# Patient Record
Sex: Male | Born: 2000 | Hispanic: Yes | Marital: Single | Smoking: Never smoker
Health system: Southern US, Community
[De-identification: ages and names within clinical notes are randomized; demographics above are authoritative.]

---
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
## 2021-01-28 ENCOUNTER — Emergency Department: Payer: Medicaid - Out of State

## 2021-01-28 ENCOUNTER — Other Ambulatory Visit: Payer: Self-pay

## 2021-01-28 ENCOUNTER — Encounter
Admission: EM | Disposition: A | Discharge status: Home / Self Care | Payer: Self-pay | Source: Home / Self Care | Attending: Neurosurgery

## 2021-01-28 ENCOUNTER — Inpatient Hospital Stay
Admission: EM | Admit: 2021-01-28 | Discharge: 2021-01-30 | DRG: 030 | Disposition: A | Discharge status: Home / Self Care | Payer: Medicaid - Out of State | Attending: Neurosurgery | Admitting: Neurosurgery

## 2021-01-28 DIAGNOSIS — G834 Cauda equina syndrome: Principal | ICD-10-CM | POA: Diagnosis present

## 2021-01-28 DIAGNOSIS — M5136 Other intervertebral disc degeneration, lumbar region: Secondary | ICD-10-CM

## 2021-01-28 DIAGNOSIS — R339 Retention of urine, unspecified: Secondary | ICD-10-CM | POA: Diagnosis present

## 2021-01-28 DIAGNOSIS — M51369 Other intervertebral disc degeneration, lumbar region without mention of lumbar back pain or lower extremity pain: Secondary | ICD-10-CM

## 2021-01-28 DIAGNOSIS — M5126 Other intervertebral disc displacement, lumbar region: Secondary | ICD-10-CM | POA: Diagnosis present

## 2021-01-28 DIAGNOSIS — M5127 Other intervertebral disc displacement, lumbosacral region: Secondary | ICD-10-CM | POA: Diagnosis present

## 2021-01-28 DIAGNOSIS — Z9889 Other specified postprocedural states: Secondary | ICD-10-CM

## 2021-01-28 DIAGNOSIS — R200 Anesthesia of skin: Secondary | ICD-10-CM

## 2021-01-28 DIAGNOSIS — Z20822 Contact with and (suspected) exposure to covid-19: Secondary | ICD-10-CM | POA: Diagnosis present

## 2021-01-28 DIAGNOSIS — R262 Difficulty in walking, not elsewhere classified: Secondary | ICD-10-CM | POA: Diagnosis present

## 2021-01-28 DIAGNOSIS — M48061 Spinal stenosis, lumbar region without neurogenic claudication: Secondary | ICD-10-CM

## 2021-01-28 HISTORY — PX: LUMBAR LAMINECTOMY/DECOMPRESSION MICRODISCECTOMY: SHX5026

## 2021-01-28 LAB — CBC
HCT: 47 % (ref 39.0–52.0)
Hemoglobin: 17.4 g/dL — ABNORMAL HIGH (ref 13.0–17.0)
MCH: 33.1 pg (ref 26.0–34.0)
MCHC: 37 g/dL — ABNORMAL HIGH (ref 30.0–36.0)
MCV: 89.5 fL (ref 80.0–100.0)
Platelets: 247 10*3/uL (ref 150–400)
RBC: 5.25 MIL/uL (ref 4.22–5.81)
RDW: 12 % (ref 11.5–15.5)
WBC: 7.2 10*3/uL (ref 4.0–10.5)
nRBC: 0 % (ref 0.0–0.2)

## 2021-01-28 LAB — URINALYSIS, COMPLETE (UACMP) WITH MICROSCOPIC
Bacteria, UA: NONE SEEN
Bilirubin Urine: NEGATIVE
Glucose, UA: NEGATIVE mg/dL
Hgb urine dipstick: NEGATIVE
Ketones, ur: 15 mg/dL — AB
Leukocytes,Ua: NEGATIVE
Nitrite: NEGATIVE
Protein, ur: NEGATIVE mg/dL
Specific Gravity, Urine: 1.025 (ref 1.005–1.030)
Squamous Epithelial / LPF: NONE SEEN (ref 0–5)
pH: 7 (ref 5.0–8.0)

## 2021-01-28 LAB — BASIC METABOLIC PANEL
Anion gap: 7 (ref 5–15)
BUN: 14 mg/dL (ref 6–20)
CO2: 28 mmol/L (ref 22–32)
Calcium: 9.4 mg/dL (ref 8.9–10.3)
Chloride: 102 mmol/L (ref 98–111)
Creatinine, Ser: 0.79 mg/dL (ref 0.61–1.24)
GFR, Estimated: >60 mL/min (ref >60)
Glucose, Bld: 94 mg/dL (ref 70–99)
Potassium: 4 mmol/L (ref 3.5–5.1)
Sodium: 137 mmol/L (ref 135–145)

## 2021-01-28 IMAGING — CR DG LUMBAR SPINE 2-3V
1 series · 3 of 3 positions shown · non-contrast
Comparison: None.

CLINICAL DATA: Back pain

EXAM:
LUMBAR SPINE - 2-3 VIEW

[Series 1: dg lumbar spine 2-3 views · 0.14mm/px · 3 of 3 slices shown]
[im 1/3]
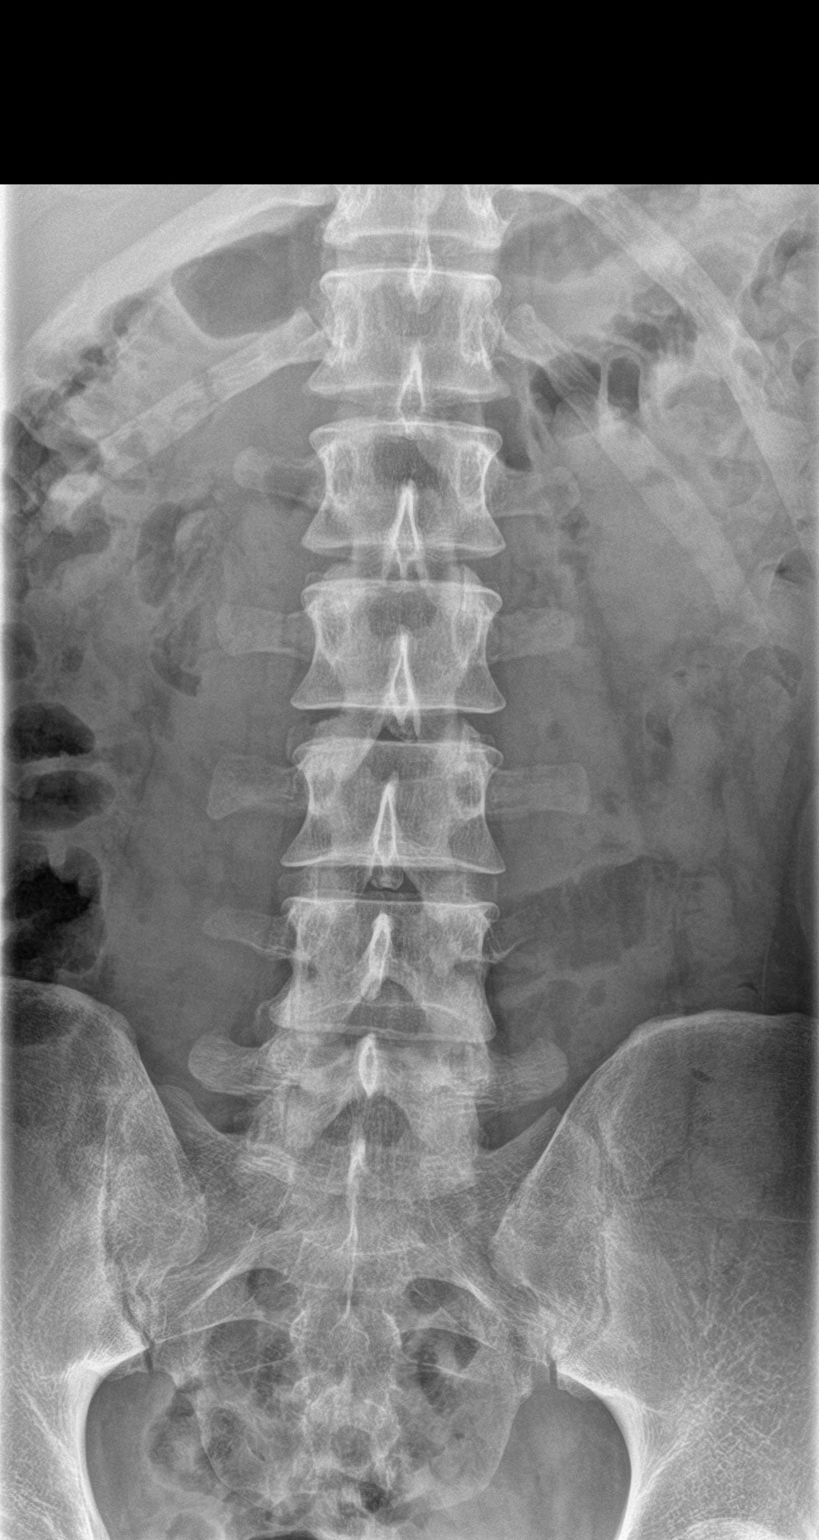
[im 2/3]
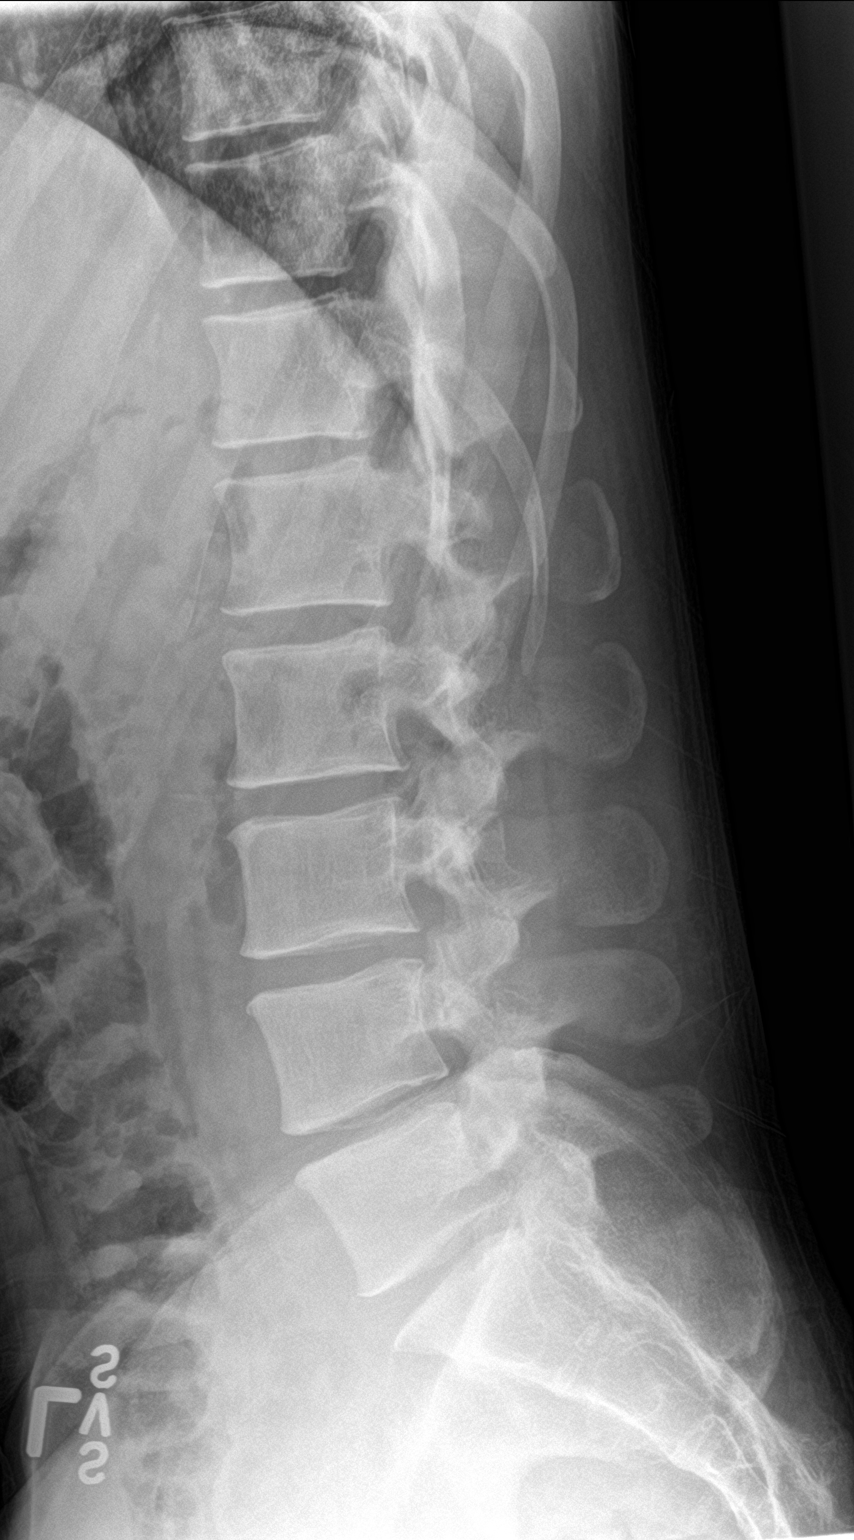
[im 3/3]
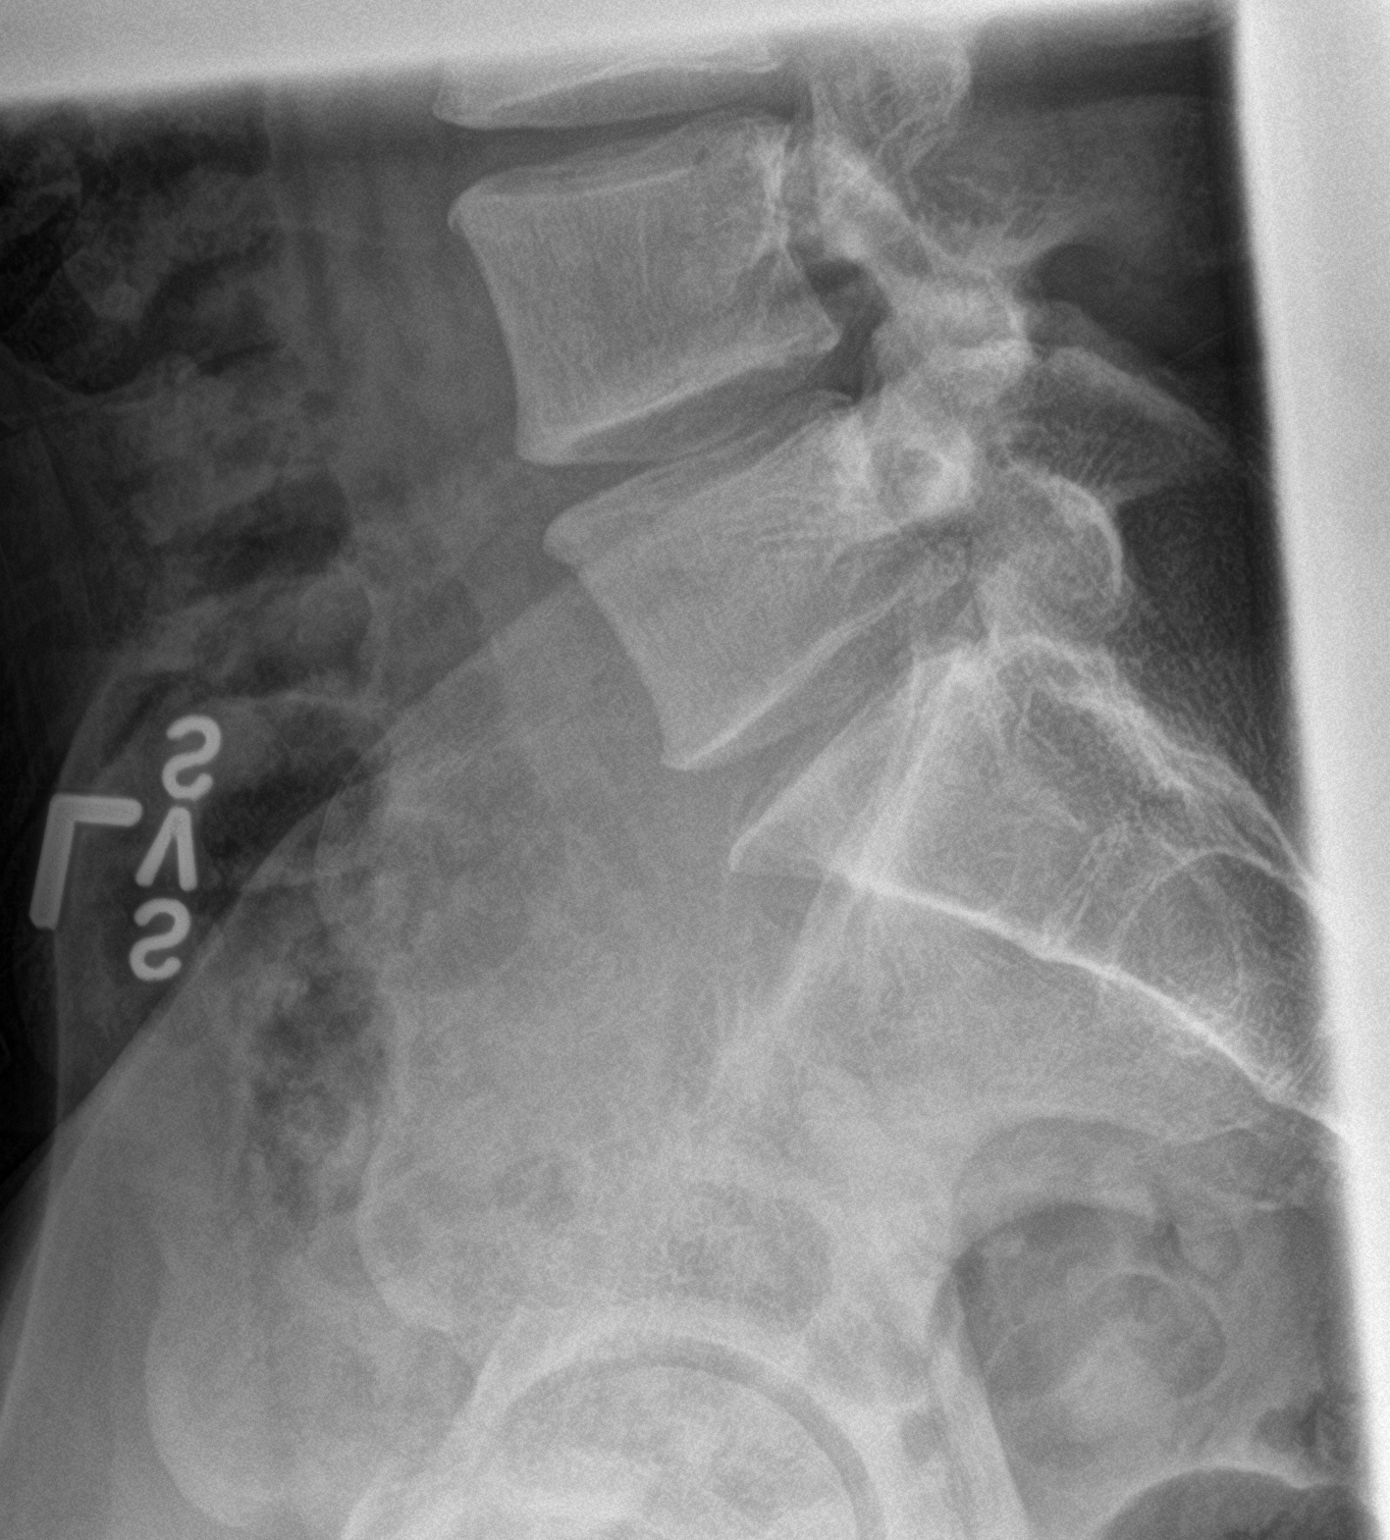

[3 of 3 positions shown; findings below may reference images not displayed]

FINDINGS: There are 5 non-rib-bearing lumbar-type vertebral bodies. Vertebral
body heights are preserved. There is no evidence of acute fracture.
Alignment is normal. The intervertebral disc spaces are preserved.
There is no significant degenerative change.

The SI joints are intact.
IMPRESSION: Normal lumbar spine radiographs.

## 2021-01-28 IMAGING — MR MR THORACIC SPINE W/O CM
6 series · 32 of 48 positions shown · non-contrast
Comparison: None.

CLINICAL DATA: Initial evaluation for intervertebral disc disorder,
numbness from tail low into both lower extremities.

EXAM:
MRI THORACIC AND LUMBAR SPINE WITHOUT CONTRAST
TECHNIQUE: Multiplanar and multiecho pulse sequences of the thoracic and lumbar
spine were obtained without intravenous contrast.

[Series 16: T1 · sagittal · 5.0mm · 1.88mm/px · 2 of 9 slices shown (1 of 2)]
[im 1/9]
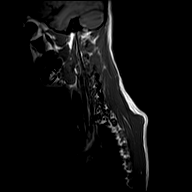
[im 9/9]
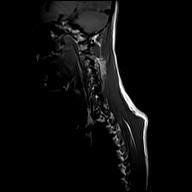

[Series 17: T2 · sagittal · 3.0mm · 1.33mm/px · 6 of 17 slices shown (1 of 2)]
[im 1/17]
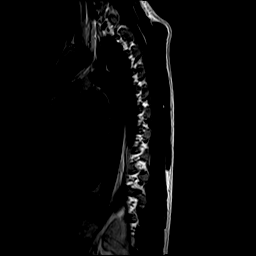
[im 4/17]
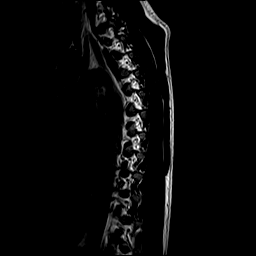
[im 7/17]
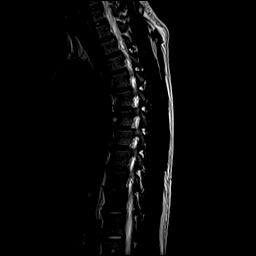
[im 10/17]
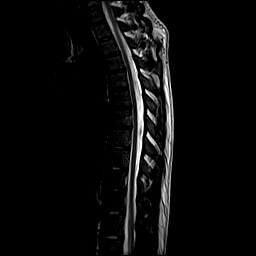
[im 13/17]
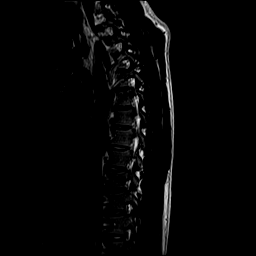
[im 17/17]
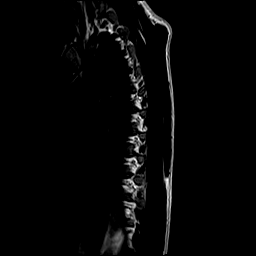

[Series 18: T1 · sagittal · 3.0mm · 1.33mm/px · 6 of 17 slices shown (2 of 2)]
[im 1/17]
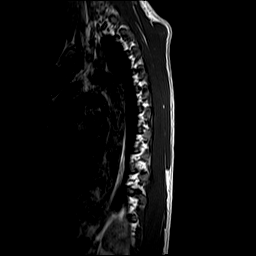
[im 4/17]
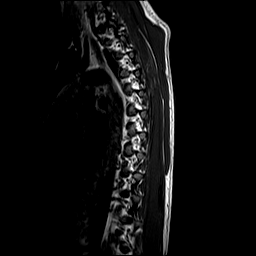
[im 7/17]
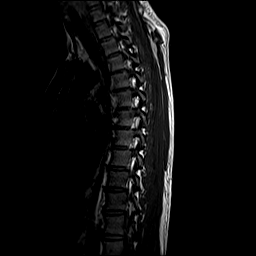
[im 10/17]
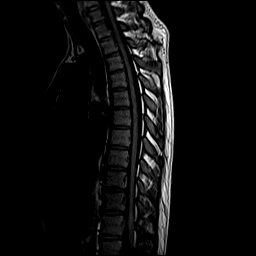
[im 13/17]
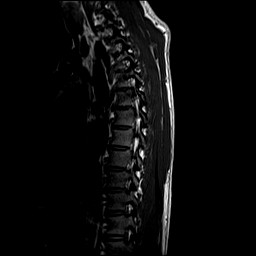
[im 17/17]
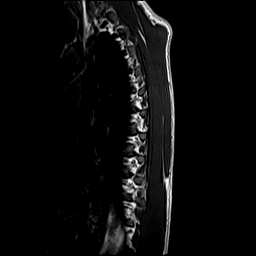

[Series 19: STIR · sagittal · 3.0mm · 0.66mm/px · 6 of 17 slices shown]
[im 1/17]
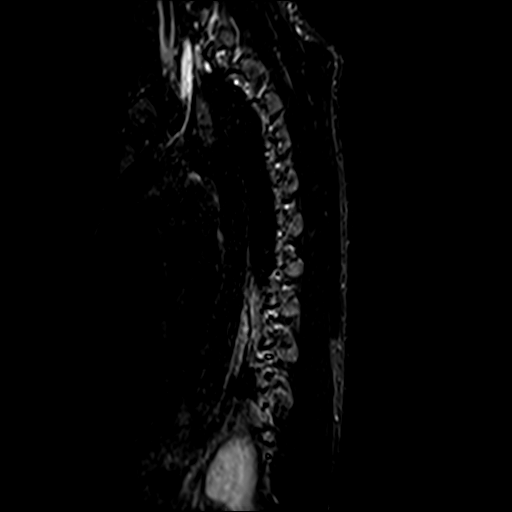
[im 4/17]
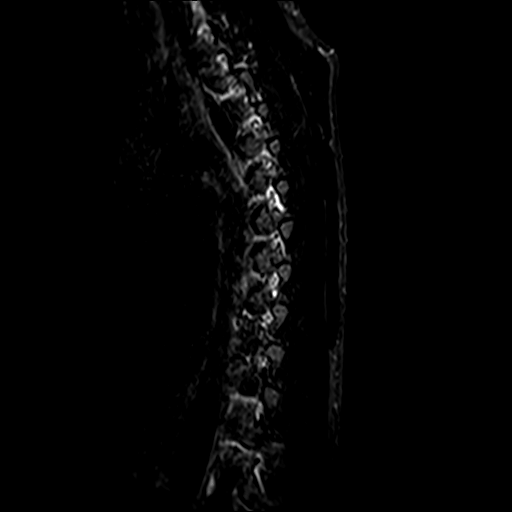
[im 7/17]
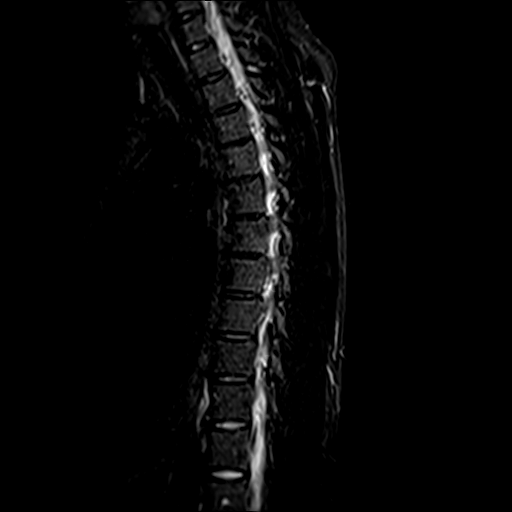
[im 10/17]
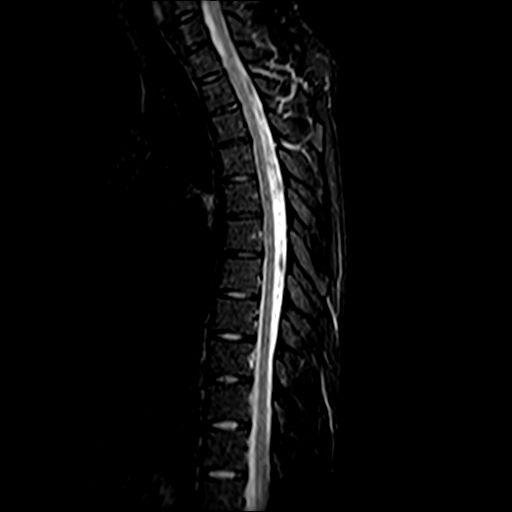
[im 13/17]
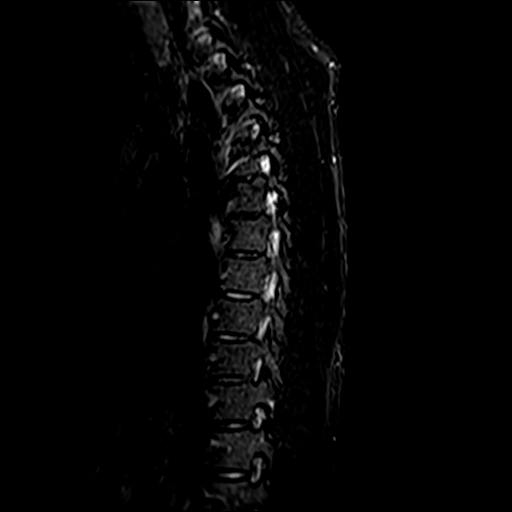
[im 17/17]
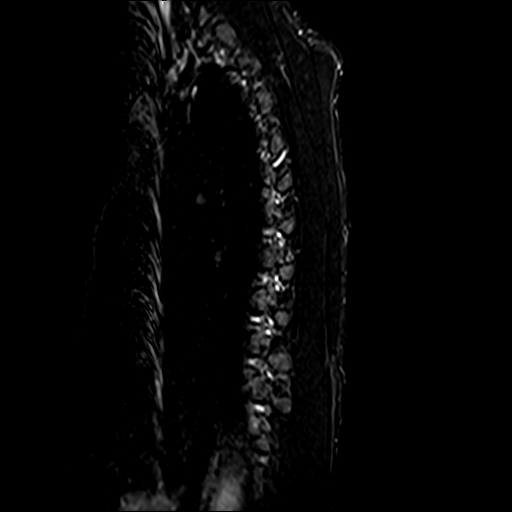

[Series 20: T2 · axial · 4.0mm · 0.59mm/px · z∈[-412,-112]mm · 8 of 39 slices shown (2 of 2)]
[im 1/39]
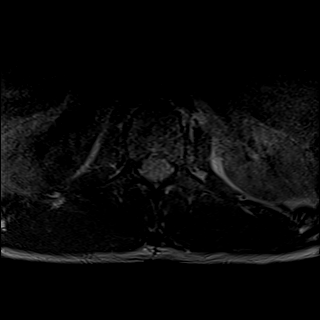
[im 6/39]
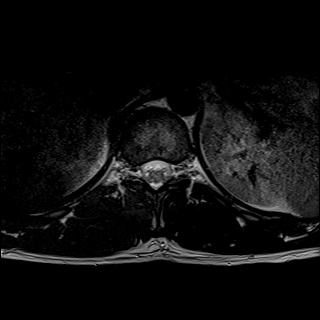
[im 12/39]
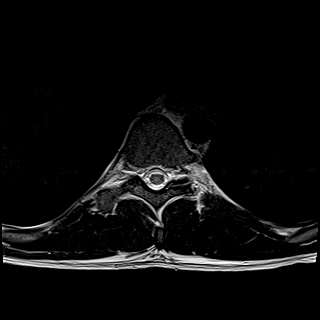
[im 18/39]
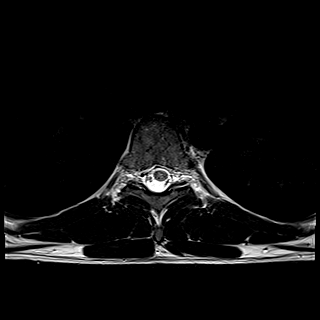
[im 21/39]
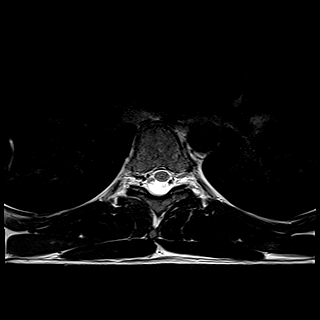
[im 27/39]
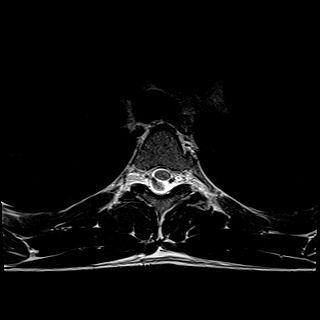
[im 33/39]
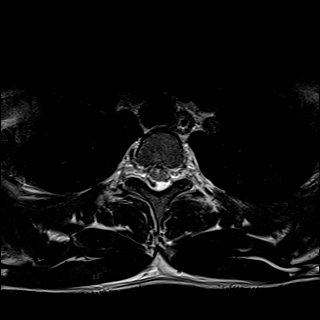
[im 39/39]
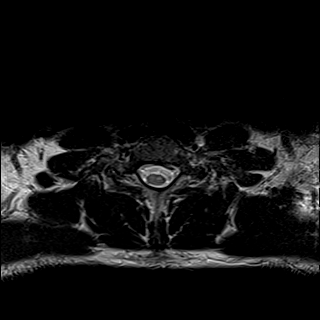

[Series 22: GRE · axial · 4.0mm · 0.37mm/px · z∈[-412,-243]mm · 4 of 39 slices shown]
[im 1/39]
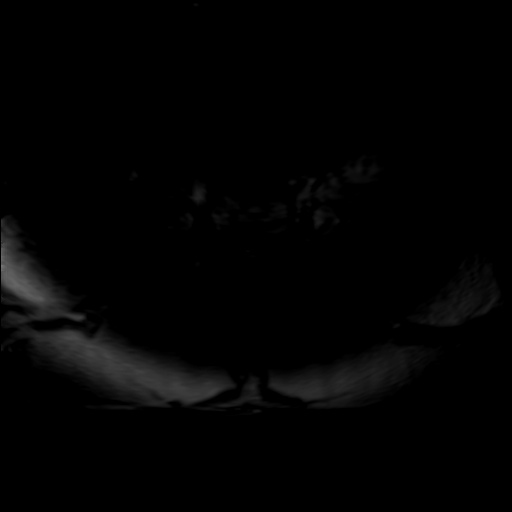
[im 6/39]
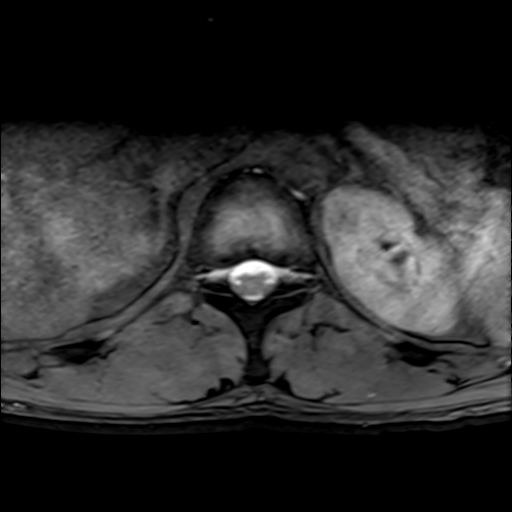
[im 12/39]
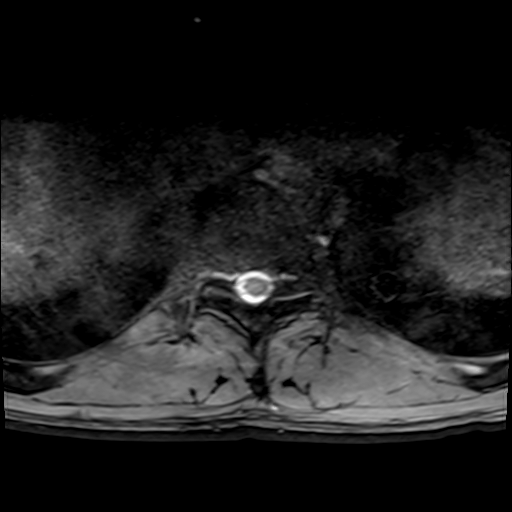
[im 18/39]
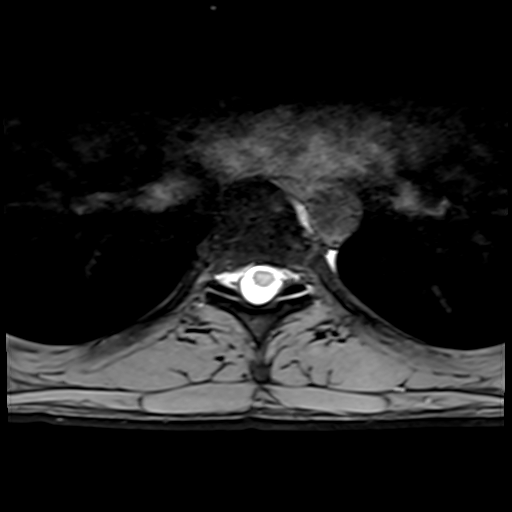

[32 of 48 positions shown; findings below may reference images not displayed]

FINDINGS: MRI THORACIC SPINE FINDINGS

Alignment: Straightening of the normal thoracic kyphosis. No
listhesis.

Vertebrae: Vertebral body height maintained without acute or chronic
fracture. Bone marrow signal intensity within normal limits. No
discrete or worrisome osseous lesions. No abnormal marrow edema.

Cord:  Normal signal and morphology.

Paraspinal and other soft tissues: Unremarkable.

Disc levels:

T3-4: Disc desiccation with tiny central disc protrusion. No
stenosis or neural impingement.

T5-6: Disc desiccation with tiny right paracentral disc protrusion.
No stenosis or neural impingement.

C6-7: Disc desiccation without significant disc bulge. No stenosis
or impingement.

Otherwise, no other significant disc pathology seen within the
thoracic spine. No other stenosis or neural impingement.

MRI LUMBAR SPINE FINDINGS

Segmentation: Standard. Lowest well-formed disc space labeled the
L5-S1 level.

Alignment: Physiologic with preservation of the normal lumbar
lordosis. No listhesis.

Vertebrae: Vertebral body height maintained without acute or chronic
fracture. Bone marrow signal intensity within normal limits. No
discrete or worrisome osseous lesions. No abnormal marrow edema.

Conus medullaris and cauda equina: Conus extends to the T12 level.
Conus and cauda equina appear normal.

Paraspinal and other soft tissues: Unremarkable.

Disc levels:

L1-2:  Unremarkable.

L2-3: Disc desiccation with mild disc bulge. Small central annular
fissure. Mild lateral recess narrowing without significant narrowing
of the central canal. Foramina remain patent.

L3-4: Disc desiccation with mild disc bulge. Small central annular
fissure. Resultant mild narrowing of the lateral recesses
bilaterally. Foramina remain patent.

L4-5: Disc desiccation with diffuse disc bulge. Moderate sized
central to left subarticular disc extrusion indents the ventral
thecal sac. Associated superior and inferior migration of disc
material. Possible intradural component noted (series 6, image 9).
Extruded disc largely effaces the thecal sac with severe canal and
left greater than right lateral recess stenosis. Foramina remain
patent.

L5-S1: Disc desiccation with mild intervertebral disc space
narrowing. Superimposed central to right subarticular disc
protrusion with slight superior migration. Prominent annular
fissure. Resultant moderate right worse than left lateral recess
stenosis. Central canal remains patent. No significant foraminal
narrowing.
IMPRESSION: 1. Moderate-sized central to left subarticular disc extrusion at
L4-5 with resultant severe canal and left greater than right lateral
recess stenosis. Neuro surgical consultation recommended.
2. Central to right subarticular disc protrusion at L5-S1 with
resultant moderate right worse than left lateral recess stenosis.
Either of the descending S1 nerve roots could be affected,
particularly on the right.
3. Additional mild disc bulging at L2-3 and L3-4 with resultant mild
bilateral lateral recess stenosis.
4. Essentially normal MRI of the thoracic spine with only minor
degenerative disc disease as above. No significant stenosis or
neural impingement within the thoracic spine.

## 2021-01-28 IMAGING — MR MR LUMBAR SPINE W/O CM
5 series · 30 of 48 positions shown · non-contrast
Comparison: None.

CLINICAL DATA: Initial evaluation for intervertebral disc disorder,
numbness from tail low into both lower extremities.

EXAM:
MRI THORACIC AND LUMBAR SPINE WITHOUT CONTRAST
TECHNIQUE: Multiplanar and multiecho pulse sequences of the thoracic and lumbar
spine were obtained without intravenous contrast.

[Series 5: T1 · sagittal · 4.0mm · 0.81mm/px · 6 of 17 slices shown (1 of 2)]
[im 1/17]
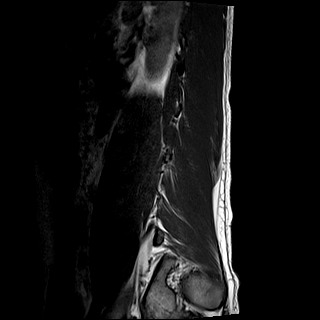
[im 4/17]
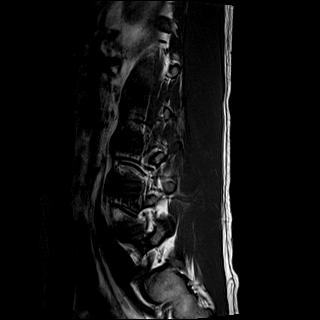
[im 7/17]
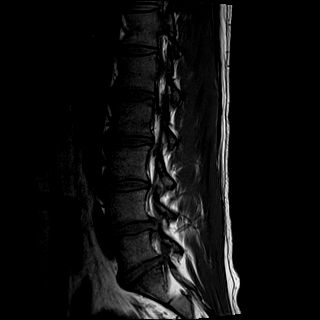
[im 10/17]
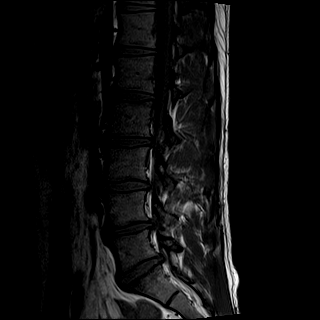
[im 13/17]
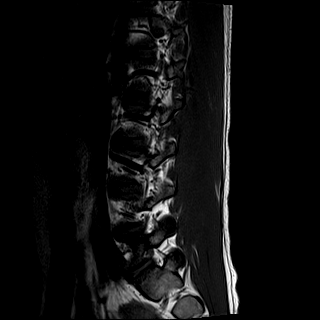
[im 17/17]
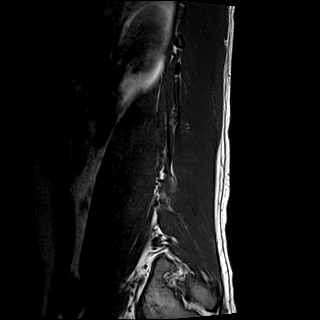

[Series 6: T2 · sagittal · 4.0mm · 0.81mm/px · 7 of 17 slices shown (1 of 2)]
[im 1/17]
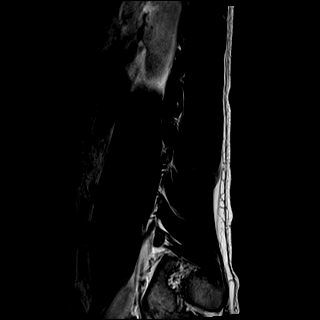
[im 3/17]
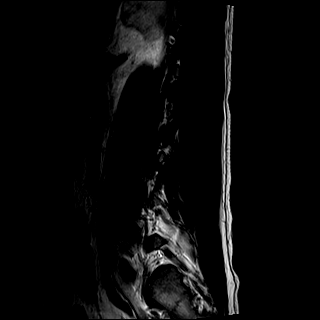
[im 6/17]
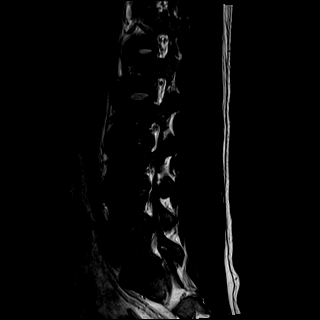
[im 9/17]
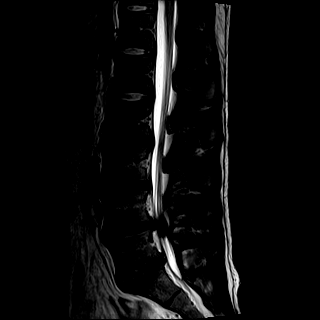
[im 11/17]
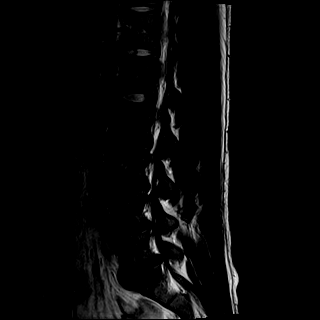
[im 14/17]
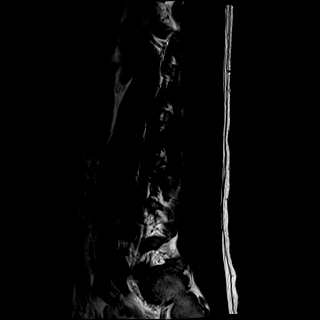
[im 17/17]
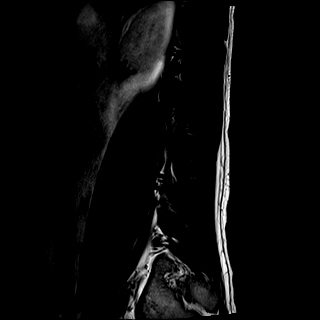

[Series 7: STIR · sagittal · 4.0mm · 0.41mm/px · 1 of 17 slices shown]
[im 1/17]
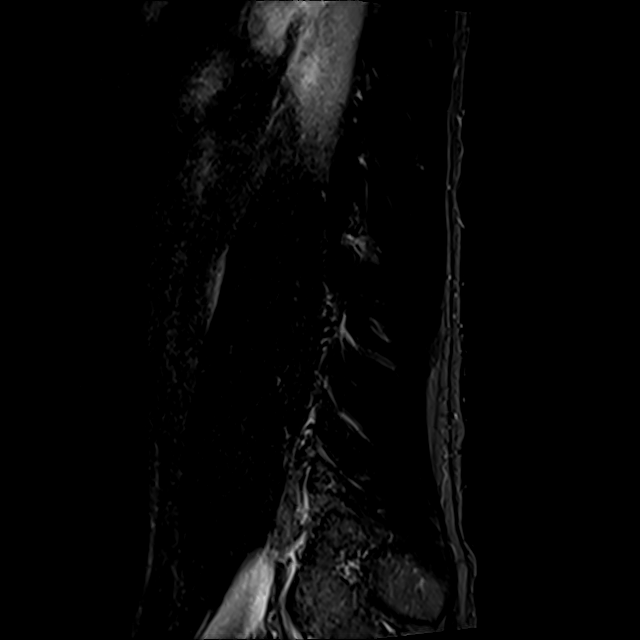

[Series 8: T2 · axial · 4.0mm · 0.78mm/px · z∈[-606,-397]mm · 8 of 37 slices shown (2 of 2)]
[im 1/37]
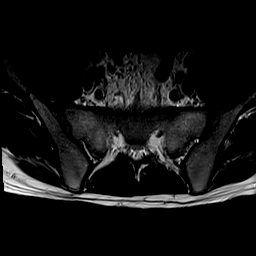
[im 6/37]
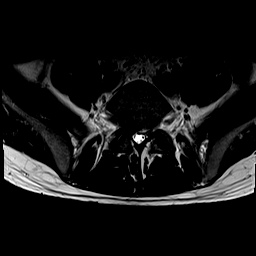
[im 12/37]
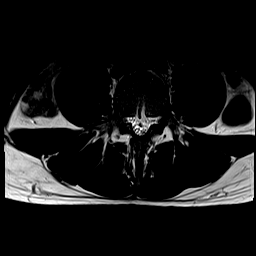
[im 17/37]
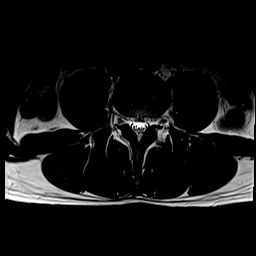
[im 20/37]
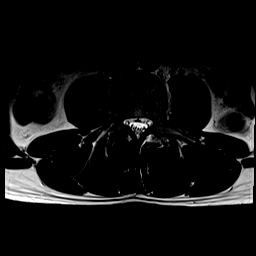
[im 25/37]
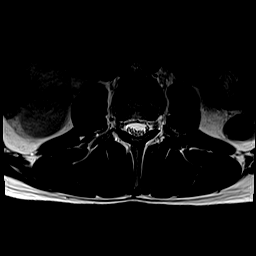
[im 31/37]
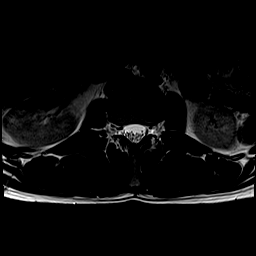
[im 37/37]
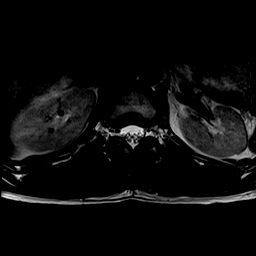

[Series 9: T1 · axial · 4.0mm · 0.39mm/px · z∈[-606,-397]mm · 8 of 37 slices shown (2 of 2)]
[im 1/37]
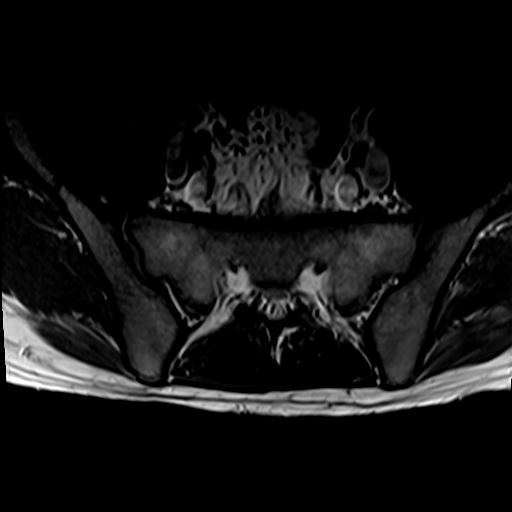
[im 6/37]
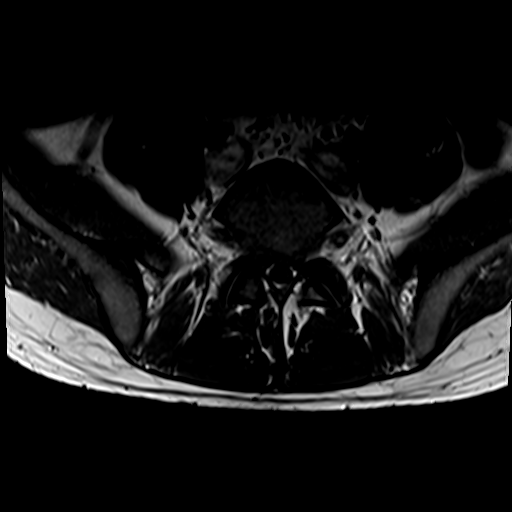
[im 12/37]
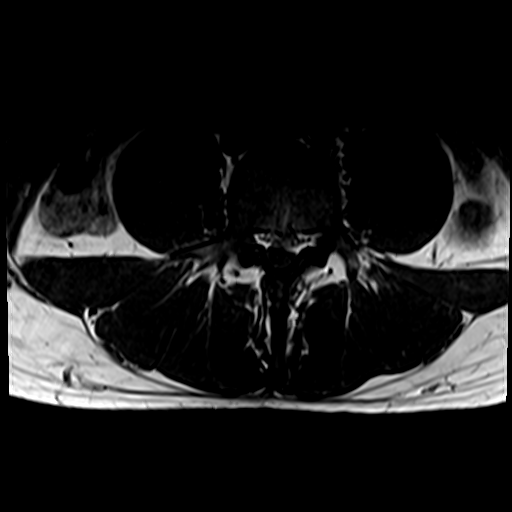
[im 17/37]
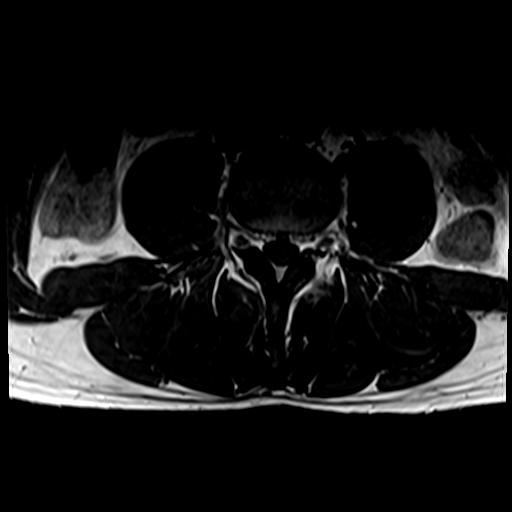
[im 20/37]
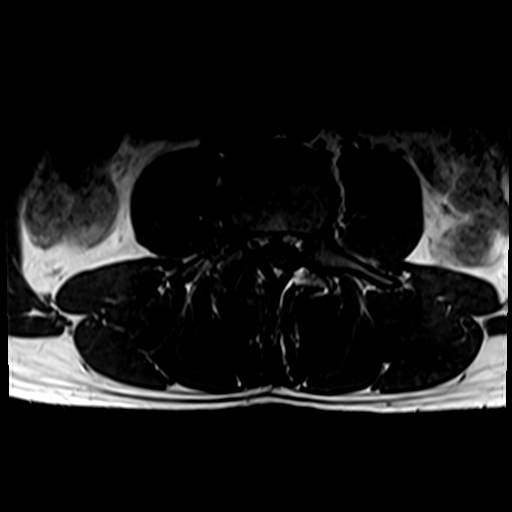
[im 25/37]
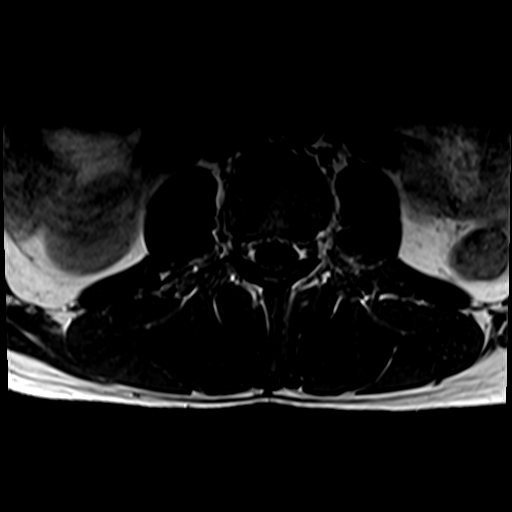
[im 31/37]
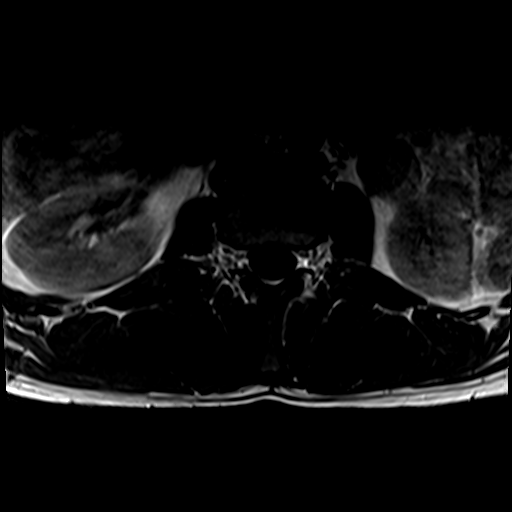
[im 37/37]
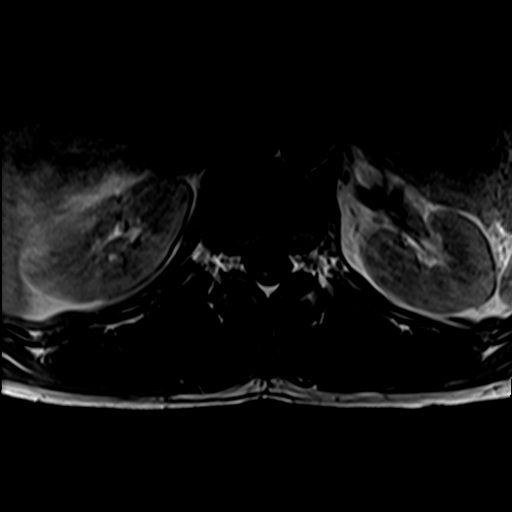

[30 of 48 positions shown; findings below may reference images not displayed]

FINDINGS: MRI THORACIC SPINE FINDINGS

Alignment: Straightening of the normal thoracic kyphosis. No
listhesis.

Vertebrae: Vertebral body height maintained without acute or chronic
fracture. Bone marrow signal intensity within normal limits. No
discrete or worrisome osseous lesions. No abnormal marrow edema.

Cord:  Normal signal and morphology.

Paraspinal and other soft tissues: Unremarkable.

Disc levels:

T3-4: Disc desiccation with tiny central disc protrusion. No
stenosis or neural impingement.

T5-6: Disc desiccation with tiny right paracentral disc protrusion.
No stenosis or neural impingement.

C6-7: Disc desiccation without significant disc bulge. No stenosis
or impingement.

Otherwise, no other significant disc pathology seen within the
thoracic spine. No other stenosis or neural impingement.

MRI LUMBAR SPINE FINDINGS

Segmentation: Standard. Lowest well-formed disc space labeled the
L5-S1 level.

Alignment: Physiologic with preservation of the normal lumbar
lordosis. No listhesis.

Vertebrae: Vertebral body height maintained without acute or chronic
fracture. Bone marrow signal intensity within normal limits. No
discrete or worrisome osseous lesions. No abnormal marrow edema.

Conus medullaris and cauda equina: Conus extends to the T12 level.
Conus and cauda equina appear normal.

Paraspinal and other soft tissues: Unremarkable.

Disc levels:

L1-2:  Unremarkable.

L2-3: Disc desiccation with mild disc bulge. Small central annular
fissure. Mild lateral recess narrowing without significant narrowing
of the central canal. Foramina remain patent.

L3-4: Disc desiccation with mild disc bulge. Small central annular
fissure. Resultant mild narrowing of the lateral recesses
bilaterally. Foramina remain patent.

L4-5: Disc desiccation with diffuse disc bulge. Moderate sized
central to left subarticular disc extrusion indents the ventral
thecal sac. Associated superior and inferior migration of disc
material. Possible intradural component noted (series 6, image 9).
Extruded disc largely effaces the thecal sac with severe canal and
left greater than right lateral recess stenosis. Foramina remain
patent.

L5-S1: Disc desiccation with mild intervertebral disc space
narrowing. Superimposed central to right subarticular disc
protrusion with slight superior migration. Prominent annular
fissure. Resultant moderate right worse than left lateral recess
stenosis. Central canal remains patent. No significant foraminal
narrowing.
IMPRESSION: 1. Moderate-sized central to left subarticular disc extrusion at
L4-5 with resultant severe canal and left greater than right lateral
recess stenosis. Neuro surgical consultation recommended.
2. Central to right subarticular disc protrusion at L5-S1 with
resultant moderate right worse than left lateral recess stenosis.
Either of the descending S1 nerve roots could be affected,
particularly on the right.
3. Additional mild disc bulging at L2-3 and L3-4 with resultant mild
bilateral lateral recess stenosis.
4. Essentially normal MRI of the thoracic spine with only minor
degenerative disc disease as above. No significant stenosis or
neural impingement within the thoracic spine.

## 2021-01-28 SURGERY — LUMBAR LAMINECTOMY/DECOMPRESSION MICRODISCECTOMY 2 LEVELS
Anesthesia: General | Site: Back | Laterality: Bilateral

## 2021-01-28 MED ORDER — ROCURONIUM BROMIDE 10 MG/ML (PF) SYRINGE
PREFILLED_SYRINGE | INTRAVENOUS | Status: AC
  Filled 2021-01-28: qty 10

## 2021-01-28 MED ORDER — FENTANYL CITRATE (PF) 100 MCG/2ML IJ SOLN
INTRAMUSCULAR | Status: AC
  Filled 2021-01-28: qty 2

## 2021-01-28 MED ORDER — THROMBIN 5000 UNITS EX SOLR
CUTANEOUS | Status: AC
  Filled 2021-01-28: qty 10000

## 2021-01-28 MED ORDER — BUPIVACAINE-EPINEPHRINE (PF) 0.5% -1:200000 IJ SOLN
INTRAMUSCULAR | Status: AC
  Filled 2021-01-28: qty 30

## 2021-01-28 MED ORDER — SODIUM CHLORIDE FLUSH 0.9 % IV SOLN
INTRAVENOUS | Status: AC
  Filled 2021-01-28: qty 20

## 2021-01-28 MED ORDER — PROPOFOL 10 MG/ML IV BOLUS
INTRAVENOUS | Status: AC
  Filled 2021-01-28: qty 20

## 2021-01-28 MED ORDER — METHYLPREDNISOLONE ACETATE 40 MG/ML IJ SUSP
INTRAMUSCULAR | Status: AC
  Filled 2021-01-28: qty 1

## 2021-01-28 MED ORDER — BUPIVACAINE LIPOSOME 1.3 % IJ SUSP
INTRAMUSCULAR | Status: AC
  Filled 2021-01-28: qty 20

## 2021-01-28 MED ORDER — BUPIVACAINE HCL (PF) 0.5 % IJ SOLN
INTRAMUSCULAR | Status: AC
  Filled 2021-01-28: qty 30

## 2021-01-28 MED ORDER — MIDAZOLAM HCL 2 MG/2ML IJ SOLN
INTRAMUSCULAR | Status: AC
  Filled 2021-01-28: qty 2

## 2021-01-28 MED ORDER — LIDOCAINE HCL (PF) 2 % IJ SOLN
INTRAMUSCULAR | Status: AC
  Filled 2021-01-28: qty 5

## 2021-01-28 MED ORDER — SUCCINYLCHOLINE CHLORIDE 200 MG/10ML IV SOSY
PREFILLED_SYRINGE | INTRAVENOUS | Status: AC
  Filled 2021-01-28: qty 10

## 2021-01-28 SURGICAL SUPPLY — 51 items
BUR NEURO DRILL SOFT 3.0X3.8M (BURR) ×2 IMPLANT
CHLORAPREP W/TINT 26 (MISCELLANEOUS) ×4 IMPLANT
CNTNR SPEC 2.5X3XGRAD LEK (MISCELLANEOUS) ×1
CONT SPEC 4OZ STER OR WHT (MISCELLANEOUS) ×1
CONTAINER SPEC 2.5X3XGRAD LEK (MISCELLANEOUS) ×1 IMPLANT
COUNTER NEEDLE 20/40 LG (NEEDLE) ×2 IMPLANT
CUP MEDICINE 2OZ PLAST GRAD ST (MISCELLANEOUS) ×2 IMPLANT
DERMABOND ADVANCED (GAUZE/BANDAGES/DRESSINGS) ×1
DERMABOND ADVANCED .7 DNX12 (GAUZE/BANDAGES/DRESSINGS) ×1 IMPLANT
DRAPE C ARM PK CFD 31 SPINE (DRAPES) ×2 IMPLANT
DRAPE LAPAROTOMY 100X77 ABD (DRAPES) ×2 IMPLANT
DRAPE MICROSCOPE SPINE 48X150 (DRAPES) ×2 IMPLANT
DRAPE SURG 17X11 SM STRL (DRAPES) ×8 IMPLANT
ELECT CAUTERY BLADE TIP 2.5 (TIP) ×2
ELECT EZSTD 165MM 6.5IN (MISCELLANEOUS) ×2
ELECT REM PT RETURN 9FT ADLT (ELECTROSURGICAL) ×2
ELECTRODE CAUTERY BLDE TIP 2.5 (TIP) ×1 IMPLANT
ELECTRODE EZSTD 165MM 6.5IN (MISCELLANEOUS) ×1 IMPLANT
ELECTRODE REM PT RTRN 9FT ADLT (ELECTROSURGICAL) ×1 IMPLANT
GAUZE 4X4 16PLY ~~LOC~~+RFID DBL (SPONGE) ×2 IMPLANT
GLOVE PROTEXIS LATEX SZ 7.5 (GLOVE) ×8 IMPLANT
GLOVE SURG SYN 6.5 ES PF (GLOVE) IMPLANT
GLOVE SURG SYN 8.5  E (GLOVE) ×4
GLOVE SURG SYN 8.5 E (GLOVE) ×4 IMPLANT
GLOVE SURG UNDER LTX SZ8 (GLOVE) ×2 IMPLANT
GLOVE SURG UNDER POLY LF SZ6.5 (GLOVE) IMPLANT
GOWN SRG LRG LVL 4 IMPRV REINF (GOWNS) ×1 IMPLANT
GOWN SRG XL LVL 3 NONREINFORCE (GOWNS) ×1 IMPLANT
GOWN STRL NON-REIN TWL XL LVL3 (GOWNS) ×1
GOWN STRL REIN LRG LVL4 (GOWNS) ×1
GRADUATE 1200CC STRL 31836 (MISCELLANEOUS) ×2 IMPLANT
KIT SPINAL PRONEVIEW (KITS) ×2 IMPLANT
MANIFOLD NEPTUNE II (INSTRUMENTS) ×2 IMPLANT
MARKER SKIN DUAL TIP RULER LAB (MISCELLANEOUS) ×4 IMPLANT
NDL SAFETY ECLIPSE 18X1.5 (NEEDLE) ×3 IMPLANT
NEEDLE HYPO 18GX1.5 SHARP (NEEDLE) ×3
NEEDLE HYPO 22GX1.5 SAFETY (NEEDLE) ×2 IMPLANT
NS IRRIG 1000ML POUR BTL (IV SOLUTION) ×2 IMPLANT
PACK LAMINECTOMY NEURO (CUSTOM PROCEDURE TRAY) ×2 IMPLANT
SPOGE SURGIFLO 8M (HEMOSTASIS) ×1
SPONGE SURGIFLO 8M (HEMOSTASIS) ×1 IMPLANT
SUT DVC VLOC 3-0 CL 6 P-12 (SUTURE) ×2 IMPLANT
SUT VIC AB 0 CT1 27 (SUTURE) ×1
SUT VIC AB 0 CT1 27XCR 8 STRN (SUTURE) ×1 IMPLANT
SUT VIC AB 2-0 CT1 18 (SUTURE) ×4 IMPLANT
SYR 30ML LL (SYRINGE) ×4 IMPLANT
SYR 3ML LL SCALE MARK (SYRINGE) ×2 IMPLANT
TOWEL OR 17X26 4PK STRL BLUE (TOWEL DISPOSABLE) ×6 IMPLANT
TRAY FOLEY MTR SLVR 16FR STAT (SET/KITS/TRAYS/PACK) ×2 IMPLANT
TUBING CONNECTING 10 (TUBING) ×2 IMPLANT
WATER STERILE IRR 500ML POUR (IV SOLUTION) ×2 IMPLANT

## 2021-01-28 NOTE — ED Notes (Signed)
Pt would like a few mins to call family before this RN inserts IV.

## 2021-01-28 NOTE — ED Notes (Signed)
Patient transported to MRI 

## 2021-01-28 NOTE — ED Triage Notes (Signed)
Pt to ED for numbness from tailbone down both legs that started Saturday. States was seen at ER and clinic, has not improved Pt able to move legs.  Verbal orders Dr Katrinka Blazing

## 2021-01-28 NOTE — Anesthesia Preprocedure Evaluation (Signed)
Anesthesia Evaluation  Patient identified by MRN, date of birth, ID band Patient awake    Reviewed: Allergy & Precautions, H&P , NPO status , Patient's Chart, lab work & pertinent test results, reviewed documented beta blocker date and time   Airway Mallampati: II  TM Distance: >3 FB Neck ROM: full    Dental  (+) Teeth Intact   Pulmonary neg pulmonary ROS,    Pulmonary exam normal        Cardiovascular Exercise Tolerance: Good negative cardio ROS Normal cardiovascular exam Rhythm:regular Rate:Normal     Neuro/Psych negative neurological ROS  negative psych ROS   GI/Hepatic negative GI ROS, Neg liver ROS,   Endo/Other  negative endocrine ROS  Renal/GU negative Renal ROS  negative genitourinary   Musculoskeletal   Abdominal   Peds  Hematology negative hematology ROS (+)   Anesthesia Other Findings History reviewed. No pertinent past medical history. History reviewed. No pertinent surgical history. BMI    Body Mass Index: 23.12 kg/m     Reproductive/Obstetrics negative OB ROS                             Anesthesia Physical Anesthesia Plan  ASA: 2 and emergent  Anesthesia Plan: General ETT   Post-op Pain Management:    Induction:   PONV Risk Score and Plan: 3  Airway Management Planned:   Additional Equipment:   Intra-op Plan:   Post-operative Plan:   Informed Consent: I have reviewed the patients History and Physical, chart, labs and discussed the procedure including the risks, benefits and alternatives for the proposed anesthesia with the patient or authorized representative who has indicated his/her understanding and acceptance.     Dental Advisory Given  Plan Discussed with: CRNA  Anesthesia Plan Comments:         Anesthesia Quick Evaluation

## 2021-01-28 NOTE — ED Provider Notes (Signed)
[facility] Emergency Department Provider Note  ____________________________________________  Time seen: Approximately 4:22 PM  I have reviewed the triage vital signs and the nursing notes.   HISTORY  Chief Complaint Numbness    HPI Juan Serrano is a 20 y.o. male who presents the emergency department complaining of a numbness down his coccyx/perineal area down his legs.  Patient states that he has a history of herniated disc, has sciatic issues and states that he was trying to do squats with heavy weight prior to the onset of the symptoms currently.  No urinary complaints of dysuria, hematuria but patient has had some urinary retention.  He is also had some constipation since the symptoms started.  This is atypical for the patient.  Patient denies any loss of range of motion to his lower extremities.  No abdominal pain.  No history of similar symptoms in the past.  This not consistent with his normal sciatica.  Patient states that he is numb in the saddle distribution.  He states that he cannot contract his pelvic floor muscles.  Patient states that he has numbness extending down his legs.  There is no complete loss of sensation but he does have decreased sensation.  No coolness or discoloration of his lower extremities have been reported.   Patient states that he did injure his back earlier this year, stated that he had to lay off weightlifting for a time.  Eventually symptoms had improved and he had returned to his normal activities.  As he had been able to exercise without increase symptoms he started to lift heavier and was lifting very heavy with squats 2 days prior to the onset of this symptom.        History reviewed. No pertinent past medical history.  There are no problems to display for this patient.   History reviewed. No pertinent surgical history.  Prior to Admission medications   Not on File    Allergies Patient has no known  allergies.  No family history on file.  Social History Social History   Tobacco Use   Smoking status: Never  Substance Use Topics   Alcohol use: No   Drug use: No     Review of Systems  Constitutional: No fever/chills Eyes: No visual changes. No discharge ENT: No upper respiratory complaints. Cardiovascular: no chest pain. Respiratory: no cough. No SOB. Gastrointestinal: No abdominal pain.  No nausea, no vomiting.  No diarrhea.  No constipation. Genitourinary: Negative for dysuria. No hematuria.  Positive for urinary retention. Musculoskeletal: No acute back pain note patient is experiencing significant numbness in the saddle distribution with urinary retention and constipation. Skin: Negative for rash, abrasions, lacerations, ecchymosis. Neurological: Negative for headaches, focal weakness or numbness.  10 System ROS otherwise negative.  ____________________________________________   PHYSICAL EXAM:  VITAL SIGNS: ED Triage Vitals  Enc Vitals Group     BP 01/28/21 1259 131/69     Pulse Rate 01/28/21 1259 77     Resp 01/28/21 1259 18     Temp 01/28/21 1259 98.5 F (36.9 C)     Temp Source 01/28/21 1259 Oral     SpO2 01/28/21 1259 99 %     Weight 01/28/21 1300 185 lb (83.9 kg)     Height 01/28/21 1300 6\' 3"  (1.905 m)     Head Circumference --      Peak Flow --      Pain Score --      Pain Loc --  Pain Edu? --      Excl. in GC? --      Constitutional: Alert and oriented. Well appearing and in no acute distress. Eyes: Conjunctivae are normal. PERRL. EOMI. Head: Atraumatic. ENT:      Ears:       Nose: No congestion/rhinnorhea.      Mouth/Throat: Mucous membranes are moist.  Neck: No stridor.  No cervical spine tenderness to palpation. Hematological/Lymphatic/Immunilogical: No cervical lymphadenopathy. Cardiovascular: Normal rate, regular rhythm. Normal S1 and S2.  Good peripheral circulation. Respiratory: Normal respiratory effort without tachypnea or  retractions. Lungs CTAB. Good air entry to the bases with no decreased or absent breath sounds. Gastrointestinal: Bowel sounds 4 quadrants. Soft and nontender to palpation. No guarding or rigidity. No palpable masses. No distention. No CVA tenderness. Musculoskeletal: Full range of motion to all extremities. No gross deformities appreciated.  Visualization of the thoracic and lumbar spine reveals no visible signs of trauma.  Patient is nontender over the thoracic and lumbar spine regions both midline and paraspinal muscle group.  No tenderness extending into the SI joints or sciatic notch.  Patient has some numbness starting in the perianal region extending along the medial aspects of the legs.  No acute complete loss of sensation.  Patient is full range of motion to hip, knee, ankle joint.  Dorsalis pedis pulse intact.  There is no weakness to either lower extremity. Neurologic:  Normal speech and language. No gross focal neurologic deficits are appreciated.  Skin:  Skin is warm, dry and intact. No rash noted. Psychiatric: Mood and affect are normal. Speech and behavior are normal. Patient exhibits appropriate insight and judgement.   ____________________________________________   LABS (all labs ordered are listed, but only abnormal results are displayed)  Labs Reviewed  CBC - Abnormal; Notable for the following components:      Result Value   Hemoglobin 17.4 (*)    MCHC 37.0 (*)    All other components within normal limits  URINALYSIS, COMPLETE (UACMP) WITH MICROSCOPIC - Abnormal; Notable for the following components:   Ketones, ur 15 (*)    All other components within normal limits  RESP PANEL BY RT-PCR (FLU A&B, COVID) ARPGX2  BASIC METABOLIC PANEL   ____________________________________________  EKG   ____________________________________________  RADIOLOGY I personally viewed and evaluated these images as part of my medical decision making, as well as reviewing the written  report by the radiologist.  ED Provider Interpretation: Imaging of the abdomen and lumbar spine with x-ray was unremarkable.  Patient had MRI of the thoracic and lumbar spine which reveals severe stenosis at the L4 from disc bulge.  Patient also has moderate to significant disc bulge at L5 and mild bulging at L2 and L3.  DG Lumbar Spine 2-3 Views  Result Date: 01/28/2021 CLINICAL DATA:  Back pain EXAM: LUMBAR SPINE - 2-3 VIEW COMPARISON:  None. FINDINGS: There are 5 non-rib-bearing lumbar-type vertebral bodies. Vertebral body heights are preserved. There is no evidence of acute fracture. Alignment is normal. The intervertebral disc spaces are preserved. There is no significant degenerative change. The SI joints are intact. IMPRESSION: Normal lumbar spine radiographs. Electronically Signed   By: Lesia Hausen M.D.   On: 01/28/2021 17:00   DG Abdomen 1 View  Result Date: 01/28/2021 CLINICAL DATA:  Back pain, constipation EXAM: ABDOMEN - 1 VIEW COMPARISON:  None. FINDINGS: There is a nonobstructive bowel gas pattern. There is no gross organomegaly or abnormal soft tissue calcification. There is no abnormally large stool burden  identified. The lung bases are clear.  There is no acute osseous abnormality. IMPRESSION: Normal abdominal radiographs. Electronically Signed   By: Lesia Hausen M.D.   On: 01/28/2021 16:58   MR THORACIC SPINE WO CONTRAST  Result Date: 01/28/2021 CLINICAL DATA:  Initial evaluation for intervertebral disc disorder, numbness from tail low into both lower extremities. EXAM: MRI THORACIC AND LUMBAR SPINE WITHOUT CONTRAST TECHNIQUE: Multiplanar and multiecho pulse sequences of the thoracic and lumbar spine were obtained without intravenous contrast. COMPARISON:  None. FINDINGS: MRI THORACIC SPINE FINDINGS Alignment: Straightening of the normal thoracic kyphosis. No listhesis. Vertebrae: Vertebral body height maintained without acute or chronic fracture. Bone marrow signal intensity within  normal limits. No discrete or worrisome osseous lesions. No abnormal marrow edema. Cord:  Normal signal and morphology. Paraspinal and other soft tissues: Unremarkable. Disc levels: T3-4: Disc desiccation with tiny central disc protrusion. No stenosis or neural impingement. T5-6: Disc desiccation with tiny right paracentral disc protrusion. No stenosis or neural impingement. C6-7: Disc desiccation without significant disc bulge. No stenosis or impingement. Otherwise, no other significant disc pathology seen within the thoracic spine. No other stenosis or neural impingement. MRI LUMBAR SPINE FINDINGS Segmentation: Standard. Lowest well-formed disc space labeled the L5-S1 level. Alignment: Physiologic with preservation of the normal lumbar lordosis. No listhesis. Vertebrae: Vertebral body height maintained without acute or chronic fracture. Bone marrow signal intensity within normal limits. No discrete or worrisome osseous lesions. No abnormal marrow edema. Conus medullaris and cauda equina: Conus extends to the T12 level. Conus and cauda equina appear normal. Paraspinal and other soft tissues: Unremarkable. Disc levels: L1-2:  Unremarkable. L2-3: Disc desiccation with mild disc bulge. Small central annular fissure. Mild lateral recess narrowing without significant narrowing of the central canal. Foramina remain patent. L3-4: Disc desiccation with mild disc bulge. Small central annular fissure. Resultant mild narrowing of the lateral recesses bilaterally. Foramina remain patent. L4-5: Disc desiccation with diffuse disc bulge. Moderate sized central to left subarticular disc extrusion indents the ventral thecal sac. Associated superior and inferior migration of disc material. Possible intradural component noted (series 6, image 9). Extruded disc largely effaces the thecal sac with severe canal and left greater than right lateral recess stenosis. Foramina remain patent. L5-S1: Disc desiccation with mild intervertebral  disc space narrowing. Superimposed central to right subarticular disc protrusion with slight superior migration. Prominent annular fissure. Resultant moderate right worse than left lateral recess stenosis. Central canal remains patent. No significant foraminal narrowing. IMPRESSION: 1. Moderate-sized central to left subarticular disc extrusion at L4-5 with resultant severe canal and left greater than right lateral recess stenosis. Neuro surgical consultation recommended. 2. Central to right subarticular disc protrusion at L5-S1 with resultant moderate right worse than left lateral recess stenosis. Either of the descending S1 nerve roots could be affected, particularly on the right. 3. Additional mild disc bulging at L2-3 and L3-4 with resultant mild bilateral lateral recess stenosis. 4. Essentially normal MRI of the thoracic spine with only minor degenerative disc disease as above. No significant stenosis or neural impingement within the thoracic spine. Electronically Signed   By: Rise Mu M.D.   On: 01/28/2021 21:44   MR LUMBAR SPINE WO CONTRAST  Result Date: 01/28/2021 CLINICAL DATA:  Initial evaluation for intervertebral disc disorder, numbness from tail low into both lower extremities. EXAM: MRI THORACIC AND LUMBAR SPINE WITHOUT CONTRAST TECHNIQUE: Multiplanar and multiecho pulse sequences of the thoracic and lumbar spine were obtained without intravenous contrast. COMPARISON:  None. FINDINGS: MRI THORACIC SPINE FINDINGS  Alignment: Straightening of the normal thoracic kyphosis. No listhesis. Vertebrae: Vertebral body height maintained without acute or chronic fracture. Bone marrow signal intensity within normal limits. No discrete or worrisome osseous lesions. No abnormal marrow edema. Cord:  Normal signal and morphology. Paraspinal and other soft tissues: Unremarkable. Disc levels: T3-4: Disc desiccation with tiny central disc protrusion. No stenosis or neural impingement. T5-6: Disc desiccation  with tiny right paracentral disc protrusion. No stenosis or neural impingement. C6-7: Disc desiccation without significant disc bulge. No stenosis or impingement. Otherwise, no other significant disc pathology seen within the thoracic spine. No other stenosis or neural impingement. MRI LUMBAR SPINE FINDINGS Segmentation: Standard. Lowest well-formed disc space labeled the L5-S1 level. Alignment: Physiologic with preservation of the normal lumbar lordosis. No listhesis. Vertebrae: Vertebral body height maintained without acute or chronic fracture. Bone marrow signal intensity within normal limits. No discrete or worrisome osseous lesions. No abnormal marrow edema. Conus medullaris and cauda equina: Conus extends to the T12 level. Conus and cauda equina appear normal. Paraspinal and other soft tissues: Unremarkable. Disc levels: L1-2:  Unremarkable. L2-3: Disc desiccation with mild disc bulge. Small central annular fissure. Mild lateral recess narrowing without significant narrowing of the central canal. Foramina remain patent. L3-4: Disc desiccation with mild disc bulge. Small central annular fissure. Resultant mild narrowing of the lateral recesses bilaterally. Foramina remain patent. L4-5: Disc desiccation with diffuse disc bulge. Moderate sized central to left subarticular disc extrusion indents the ventral thecal sac. Associated superior and inferior migration of disc material. Possible intradural component noted (series 6, image 9). Extruded disc largely effaces the thecal sac with severe canal and left greater than right lateral recess stenosis. Foramina remain patent. L5-S1: Disc desiccation with mild intervertebral disc space narrowing. Superimposed central to right subarticular disc protrusion with slight superior migration. Prominent annular fissure. Resultant moderate right worse than left lateral recess stenosis. Central canal remains patent. No significant foraminal narrowing. IMPRESSION: 1.  Moderate-sized central to left subarticular disc extrusion at L4-5 with resultant severe canal and left greater than right lateral recess stenosis. Neuro surgical consultation recommended. 2. Central to right subarticular disc protrusion at L5-S1 with resultant moderate right worse than left lateral recess stenosis. Either of the descending S1 nerve roots could be affected, particularly on the right. 3. Additional mild disc bulging at L2-3 and L3-4 with resultant mild bilateral lateral recess stenosis. 4. Essentially normal MRI of the thoracic spine with only minor degenerative disc disease as above. No significant stenosis or neural impingement within the thoracic spine. Electronically Signed   By: Rise MuBenjamin  McClintock M.D.   On: 01/28/2021 21:44    ____________________________________________    PROCEDURES  Procedure(s) performed:    Procedures    Medications  ceFAZolin (ANCEF) IVPB 2g/100 mL premix (has no administration in time range)     ____________________________________________   INITIAL IMPRESSION / ASSESSMENT AND PLAN / ED COURSE  Pertinent labs & imaging results that were available during my care of the patient were reviewed by me and considered in my medical decision making (see chart for details).  Review of the [place] CSRS was performed in accordance of the [facility] prior to dispensing any controlled drugs.           Patient's diagnosis is consistent with spinal stenosis from bulging lumbar disc with saddle anesthesia.  Patient presented to the emergency department with urinary retention, constipation and numbness in the saddle distribution.  Patient states that he had injured his back with bulging disc earlier this year.  After this had apparently healed he had returned to the gym and perform significant squats with heavy weight 6 days ago.  2 days after this incident patient started to develop saddle anesthesia, urinary retention and constipation.  Patient states that he  will have the urge to go but unless he forces his urination he cannot pee.  He has not been able to have bowel movements since the symptoms started.  Patient is numb and states that he cannot engage his pelvic floor muscles at this time.  He was noted in this area as well as down bilateral lower extremities.  Sensation was still intact but it was decreased.  Dorsalis pedis pulse intact.  Given the symptoms I was concerned for cauda equina versus lumbar stenosis from bulging disc versus herniated disc.  Patient had initial x-rays to ensure no constipation was a source of patient's symptoms as well as any obvious abnormality on x-ray.  These are reassuring.  MRI was obtained given the patient's symptoms which returns with severe stenosis.  I discussed the patient with the on-call neurosurgeon, Dr. Marcell Barlow.  After discussing the patient's HPI, physical exam, imaging results, Dr. Marcell Barlow evaluated the MRI and determined that the patient would be a good candidate for microdiscectomy.  Dr. Marcell Barlow presents to the ED and evaluates the patient prior to surgical intervention tonight.  Patient will be admitted to Dr. Marcell Barlow service at this time.    ____________________________________________  FINAL CLINICAL IMPRESSION(S) / ED DIAGNOSES  Final diagnoses:  Spinal stenosis of lumbar region, unspecified whether neurogenic claudication present  Bulging lumbar disc  Saddle anesthesia      NEW MEDICATIONS STARTED DURING THIS VISIT:  ED Discharge Orders     None           This chart was dictated using voice recognition software/Dragon. Despite best efforts to proofread, errors can occur which can change the meaning. Any change was purely unintentional.    Racheal Patches, PA-C 01/29/21 0011    Merwyn Katos, MD 01/29/21 1640

## 2021-01-29 ENCOUNTER — Emergency Department: Payer: Medicaid - Out of State

## 2021-01-29 ENCOUNTER — Emergency Department: Payer: Medicaid - Out of State | Admitting: Registered Nurse

## 2021-01-29 ENCOUNTER — Encounter: Payer: Self-pay | Admitting: Neurosurgery

## 2021-01-29 ENCOUNTER — Inpatient Hospital Stay: Payer: Medicaid - Out of State

## 2021-01-29 DIAGNOSIS — G834 Cauda equina syndrome: Secondary | ICD-10-CM | POA: Diagnosis present

## 2021-01-29 DIAGNOSIS — M48061 Spinal stenosis, lumbar region without neurogenic claudication: Secondary | ICD-10-CM | POA: Diagnosis present

## 2021-01-29 DIAGNOSIS — M5126 Other intervertebral disc displacement, lumbar region: Secondary | ICD-10-CM | POA: Diagnosis present

## 2021-01-29 DIAGNOSIS — Z9889 Other specified postprocedural states: Secondary | ICD-10-CM | POA: Diagnosis present

## 2021-01-29 DIAGNOSIS — R200 Anesthesia of skin: Secondary | ICD-10-CM | POA: Diagnosis present

## 2021-01-29 DIAGNOSIS — R262 Difficulty in walking, not elsewhere classified: Secondary | ICD-10-CM | POA: Diagnosis present

## 2021-01-29 DIAGNOSIS — Z20822 Contact with and (suspected) exposure to covid-19: Secondary | ICD-10-CM | POA: Diagnosis present

## 2021-01-29 DIAGNOSIS — R339 Retention of urine, unspecified: Secondary | ICD-10-CM | POA: Diagnosis present

## 2021-01-29 DIAGNOSIS — M5127 Other intervertebral disc displacement, lumbosacral region: Secondary | ICD-10-CM | POA: Diagnosis present

## 2021-01-29 LAB — RESP PANEL BY RT-PCR (FLU A&B, COVID) ARPGX2
Influenza A by PCR: NEGATIVE
Influenza B by PCR: NEGATIVE
SARS Coronavirus 2 by RT PCR: NEGATIVE

## 2021-01-29 MED ORDER — FENTANYL CITRATE (PF) 100 MCG/2ML IJ SOLN
INTRAMUSCULAR | Status: AC
  Administered 2021-01-29: 25 ug via INTRAVENOUS
  Filled 2021-01-29: qty 2

## 2021-01-29 MED ORDER — ONDANSETRON HCL 4 MG/2ML IJ SOLN: 4.0000 mg | Freq: Four times a day (QID) | INTRAMUSCULAR | Status: DC | PRN

## 2021-01-29 MED ORDER — SODIUM CHLORIDE 0.9 % IV SOLN
INTRAVENOUS | Status: DC | PRN
  Administered 2021-01-29: 35 mL

## 2021-01-29 MED ORDER — LIDOCAINE HCL (CARDIAC) PF 100 MG/5ML IV SOSY
PREFILLED_SYRINGE | INTRAVENOUS | Status: DC | PRN
  Administered 2021-01-29: 60 mg via INTRAVENOUS

## 2021-01-29 MED ORDER — ACETAMINOPHEN 650 MG RE SUPP: 650.0000 mg | RECTAL | Status: DC | PRN

## 2021-01-29 MED ORDER — KETOROLAC TROMETHAMINE 15 MG/ML IJ SOLN
15.0000 mg | Freq: Four times a day (QID) | INTRAMUSCULAR | Status: AC
  Administered 2021-01-29 (×4): 15 mg via INTRAVENOUS
  Filled 2021-01-29 (×4): qty 1

## 2021-01-29 MED ORDER — ONDANSETRON HCL 4 MG/2ML IJ SOLN: 4.0000 mg | Freq: Once | INTRAMUSCULAR | Status: DC | PRN

## 2021-01-29 MED ORDER — PROPOFOL 10 MG/ML IV BOLUS
INTRAVENOUS | Status: DC | PRN
  Administered 2021-01-29: 200 mg via INTRAVENOUS

## 2021-01-29 MED ORDER — LACTATED RINGERS IV SOLN: INTRAVENOUS | Status: DC | PRN

## 2021-01-29 MED ORDER — CEFAZOLIN SODIUM-DEXTROSE 2-4 GM/100ML-% IV SOLN
2.0000 g | Freq: Once | INTRAVENOUS | Status: AC
  Administered 2021-01-29: 2 g via INTRAVENOUS
  Filled 2021-01-29: qty 100

## 2021-01-29 MED ORDER — FENTANYL CITRATE (PF) 100 MCG/2ML IJ SOLN
INTRAMUSCULAR | Status: DC | PRN
  Administered 2021-01-29: 100 ug via INTRAVENOUS

## 2021-01-29 MED ORDER — MIDAZOLAM HCL 2 MG/2ML IJ SOLN
INTRAMUSCULAR | Status: DC | PRN
  Administered 2021-01-29: 2 mg via INTRAVENOUS

## 2021-01-29 MED ORDER — PHENYLEPHRINE HCL (PRESSORS) 10 MG/ML IV SOLN
INTRAVENOUS | Status: DC | PRN
  Administered 2021-01-29 (×3): 50 ug via INTRAVENOUS

## 2021-01-29 MED ORDER — DEXAMETHASONE SODIUM PHOSPHATE 10 MG/ML IJ SOLN
INTRAMUSCULAR | Status: AC
  Filled 2021-01-29: qty 1

## 2021-01-29 MED ORDER — SODIUM CHLORIDE 0.9% FLUSH
3.0000 mL | Freq: Two times a day (BID) | INTRAVENOUS | Status: DC
  Administered 2021-01-29 (×2): 3 mL via INTRAVENOUS

## 2021-01-29 MED ORDER — FLEET ENEMA 7-19 GM/118ML RE ENEM: 1.0000 | ENEMA | Freq: Every day | RECTAL | Status: DC | PRN

## 2021-01-29 MED ORDER — ONDANSETRON HCL 4 MG/2ML IJ SOLN
INTRAMUSCULAR | Status: AC
  Filled 2021-01-29: qty 2

## 2021-01-29 MED ORDER — THROMBIN 5000 UNITS EX SOLR
CUTANEOUS | Status: DC | PRN
  Administered 2021-01-29: 5000 [IU] via TOPICAL

## 2021-01-29 MED ORDER — OXYCODONE HCL 5 MG PO TABS
5.0000 mg | ORAL_TABLET | Freq: Once | ORAL | Status: AC
  Administered 2021-01-29: 5 mg via ORAL
  Filled 2021-01-29: qty 1

## 2021-01-29 MED ORDER — METHOCARBAMOL 1000 MG/10ML IJ SOLN
500.0000 mg | Freq: Four times a day (QID) | INTRAVENOUS | Status: DC | PRN
  Filled 2021-01-29: qty 5

## 2021-01-29 MED ORDER — METHYLPREDNISOLONE ACETATE 40 MG/ML IJ SUSP
INTRAMUSCULAR | Status: AC
  Filled 2021-01-29: qty 1

## 2021-01-29 MED ORDER — BUPIVACAINE-EPINEPHRINE (PF) 0.5% -1:200000 IJ SOLN
INTRAMUSCULAR | Status: DC | PRN
  Administered 2021-01-29: 4 mL

## 2021-01-29 MED ORDER — FENTANYL CITRATE (PF) 100 MCG/2ML IJ SOLN
25.0000 ug | INTRAMUSCULAR | Status: DC | PRN
  Administered 2021-01-29 (×4): 25 ug via INTRAVENOUS

## 2021-01-29 MED ORDER — METHYLPREDNISOLONE ACETATE 40 MG/ML IJ SUSP
INTRAMUSCULAR | Status: DC | PRN
  Administered 2021-01-29: 40 mg

## 2021-01-29 MED ORDER — OXYCODONE HCL 5 MG PO TABS
10.0000 mg | ORAL_TABLET | ORAL | Status: DC | PRN
  Administered 2021-01-29: 10 mg via ORAL
  Filled 2021-01-29: qty 2

## 2021-01-29 MED ORDER — MENTHOL 3 MG MT LOZG
1.0000 | LOZENGE | OROMUCOSAL | Status: DC | PRN
  Filled 2021-01-29: qty 9

## 2021-01-29 MED ORDER — SENNOSIDES-DOCUSATE SODIUM 8.6-50 MG PO TABS
2.0000 | ORAL_TABLET | Freq: Two times a day (BID) | ORAL | Status: DC
  Administered 2021-01-29 – 2021-01-30 (×2): 2 via ORAL
  Filled 2021-01-29 (×2): qty 2

## 2021-01-29 MED ORDER — SODIUM CHLORIDE 0.9 % IV SOLN: 250.0000 mL | INTRAVENOUS | Status: DC

## 2021-01-29 MED ORDER — ROCURONIUM BROMIDE 100 MG/10ML IV SOLN
INTRAVENOUS | Status: DC | PRN
  Administered 2021-01-29 (×2): 10 mg via INTRAVENOUS
  Administered 2021-01-29: 60 mg via INTRAVENOUS

## 2021-01-29 MED ORDER — BISACODYL 10 MG RE SUPP
10.0000 mg | Freq: Every day | RECTAL | Status: DC | PRN
  Administered 2021-01-29: 10 mg via RECTAL

## 2021-01-29 MED ORDER — BUPIVACAINE HCL (PF) 0.5 % IJ SOLN
INTRAMUSCULAR | Status: DC | PRN
  Administered 2021-01-29: 25 mL

## 2021-01-29 MED ORDER — POTASSIUM CHLORIDE IN NACL 20-0.9 MEQ/L-% IV SOLN
INTRAVENOUS | Status: DC
  Filled 2021-01-29 (×5): qty 1000

## 2021-01-29 MED ORDER — METHOCARBAMOL 500 MG PO TABS: 500.0000 mg | ORAL_TABLET | Freq: Four times a day (QID) | ORAL | Status: DC | PRN

## 2021-01-29 MED ORDER — OXYCODONE HCL 5 MG PO TABS
5.0000 mg | ORAL_TABLET | ORAL | Status: DC | PRN
  Administered 2021-01-29 (×2): 5 mg via ORAL
  Filled 2021-01-29 (×2): qty 1

## 2021-01-29 MED ORDER — SENNA 8.6 MG PO TABS: 2.0000 | ORAL_TABLET | Freq: Every day | ORAL | Status: DC

## 2021-01-29 MED ORDER — THROMBIN 5000 UNITS EX SOLR
CUTANEOUS | Status: AC
  Filled 2021-01-29: qty 5000

## 2021-01-29 MED ORDER — PHENOL 1.4 % MT LIQD
1.0000 | OROMUCOSAL | Status: DC | PRN
  Filled 2021-01-29: qty 177

## 2021-01-29 MED ORDER — ONDANSETRON HCL 4 MG PO TABS: 4.0000 mg | ORAL_TABLET | Freq: Four times a day (QID) | ORAL | Status: DC | PRN

## 2021-01-29 MED ORDER — SUGAMMADEX SODIUM 200 MG/2ML IV SOLN
INTRAVENOUS | Status: DC | PRN
Start: 2021-01-29 — End: 2021-01-29
  Administered 2021-01-29: 200 mg via INTRAVENOUS

## 2021-01-29 MED ORDER — POLYETHYLENE GLYCOL 3350 17 G PO PACK
17.0000 g | PACK | Freq: Every day | ORAL | Status: DC | PRN
  Administered 2021-01-29: 17 g via ORAL
  Filled 2021-01-29 (×2): qty 1

## 2021-01-29 MED ORDER — SODIUM CHLORIDE 0.9% FLUSH: 3.0000 mL | INTRAVENOUS | Status: DC | PRN

## 2021-01-29 MED ORDER — 0.9 % SODIUM CHLORIDE (POUR BTL) OPTIME
TOPICAL | Status: DC | PRN
  Administered 2021-01-29: 10 mL

## 2021-01-29 MED ORDER — DEXMEDETOMIDINE (PRECEDEX) IN NS 20 MCG/5ML (4 MCG/ML) IV SYRINGE
PREFILLED_SYRINGE | INTRAVENOUS | Status: DC | PRN
  Administered 2021-01-29 (×3): 8 ug via INTRAVENOUS

## 2021-01-29 MED ORDER — ACETAMINOPHEN 325 MG PO TABS: 650.0000 mg | ORAL_TABLET | ORAL | Status: DC | PRN

## 2021-01-29 MED ORDER — ONDANSETRON HCL 4 MG/2ML IJ SOLN
INTRAMUSCULAR | Status: DC | PRN
  Administered 2021-01-29: 4 mg via INTRAVENOUS

## 2021-01-29 MED ORDER — DEXMEDETOMIDINE HCL IN NACL 200 MCG/50ML IV SOLN
INTRAVENOUS | Status: AC
  Filled 2021-01-29: qty 50

## 2021-01-29 MED ORDER — DEXAMETHASONE SODIUM PHOSPHATE 10 MG/ML IJ SOLN
INTRAMUSCULAR | Status: DC | PRN
  Administered 2021-01-29: 10 mg via INTRAVENOUS

## 2021-01-29 NOTE — Transfer of Care (Signed)
Immediate Anesthesia Transfer of Care Note  Patient: Juan Serrano  Procedure(s) Performed: Left MICRODISCECTOMY L4-5, Possible Right l4-5 microdisectomy (Bilateral: Back)  Patient Location: PACU  Anesthesia Type:General  Level of Consciousness: awake and alert   Airway & Oxygen Therapy: Patient Spontanous Breathing  Post-op Assessment: Report given to RN and Post -op Vital signs reviewed and stable  Post vital signs: Reviewed and stable  Last Vitals:  Vitals Value Taken Time  BP 141/42 01/29/21 0300  Temp    Pulse 75 01/29/21 0308  Resp 22 01/29/21 0308  SpO2 100 % 01/29/21 0308  Vitals shown include unvalidated device data.  Last Pain:  Vitals:   01/29/21 0001  TempSrc: Oral         Complications: No notable events documented.

## 2021-01-29 NOTE — TOC Progression Note (Signed)
Transition of Care [facility]) - Progression Note    Patient Details  Name: Juan Serrano MRN: [id number] Date of Birth: Jan 26, 2001  Transition of Care [facility]) CM/SW [place], RN Phone Number: 01/29/2021, 4:18 PM  Clinical Narrative:     Met with the patient and his girlfriend in the room at the bedside, He is doing well, he does not need DME He will be going to Outpatient PT after discharge, he will go to either [facility] or [facility], an order will be given at DC for him to take with him to go to Outpatient PT, He has out of state medicaid in [place] and it will work better to go there, he will have transportation to go.  No additional needs at this time       Expected Discharge Plan and Services                                                 Social Determinants of Health (SDOH) Interventions    Readmission Risk Interventions No flowsheet data found.

## 2021-01-29 NOTE — Plan of Care (Signed)
  Problem: Education: Goal: Knowledge of General Education information will improve Description: Including pain rating scale, medication(s)/side effects and non-pharmacologic comfort measures Outcome: Progressing   Problem: Health Behavior/Discharge Planning: Goal: Ability to manage health-related needs will improve Outcome: Progressing   Problem: Clinical Measurements: Goal: Ability to maintain clinical measurements within normal limits will improve Outcome: Progressing Goal: Will remain free from infection Outcome: Progressing Goal: Diagnostic test results will improve Outcome: Progressing Goal: Respiratory complications will improve Outcome: Progressing Goal: Cardiovascular complication will be avoided Outcome: Progressing   Problem: Elimination: Goal: Will not experience complications related to bowel motility Outcome: Progressing Goal: Will not experience complications related to urinary retention Outcome: Progressing   Problem: Pain Managment: Goal: General experience of comfort will improve Outcome: Progressing   

## 2021-01-29 NOTE — Evaluation (Signed)
Occupational Therapy Evaluation Patient Details Name: Juan Serrano MRN: [id number] DOB: 05-20-2001 Today's Date: 01/29/2021    History of Present Illness 20 yo male presented with cauda equina syndrome, now s/p left L4/5 microdiscectomy on 01/29/21.   Clinical Impression   Pt seen for OT evaluation this date. Prior to hospital admission, pt was independent with mobility, ADL, and IADL. However, has had increased difficulty with all aspects of ADL and mobility suddenly since powerlifting incident causing increased sensation loss in BLE. Pt lives with his mother in a single family home with 6 steps to enter and L handrail with family able to assist. Pt denied pain and reported improvement in AROM of toes in both feet. Pt currently demonstrates impairments in decreased sensation in BLE and DF strength causing impaired balance. Pt instructed in log roll for bed mobility and pt endorsed improved comfort and ease with technique. Pt able to use figure 4 technique for doffing/donning socks seated EOB and donning underwear. Pt eager to walk and negotiated the nurses station and back to room with CGA, no AD, and a couple slight LOB noted but able to self correct. OT facilitated problem solving home set up/safety while walking. Pt/girlfriend also instructed in falls prevention, positions for sleep while supporting neutral spinal positioning, home/routines modifications, AE/DME for ADL, and strategies to support ADL/IADL safety. Pt/girlfriend verbalized understanding of all education/training provided. Do not anticipate skilled OT needs following hospitalization. Will continue to see while hospitalized.     Follow Up Recommendations  No OT follow up    Equipment Recommendations  3 in 1 bedside commode    Recommendations for Other Services       Precautions / Restrictions Precautions Precautions: Fall Restrictions Weight Bearing Restrictions: No      Mobility Bed Mobility Overal bed mobility:  Needs Assistance Bed Mobility: Sit to Sidelying;Sidelying to Sit   Sidelying to sit: Supervision     Sit to sidelying: Supervision General bed mobility comments: Instructed in log roll with PRN VC for technique and pt able to return demo    Transfers Overall transfer level: Needs assistance Equipment used: None Transfers: Sit to/from Stand Sit to Stand: Min guard;From elevated surface         General transfer comment: elevated bed to mimic home set up    Balance Overall balance assessment: Needs assistance Sitting-balance support: No upper extremity supported;Feet supported Sitting balance-Leahy Scale: Good     Standing balance support: No upper extremity supported Standing balance-Leahy Scale: Fair Standing balance comment: 2 slight LOB events while negotiating hallway and around nurses station, with narrowed BOS briefly                           ADL either performed or assessed with clinical judgement   ADL Overall ADL's : Needs assistance/impaired                                       General ADL Comments: Pt demonstrated ability to use figure four technique to doff/don socks seated EOB (required UE support to bring legs over opposite knees). ADL transfers CGA. Pt donned underwear with increased time/effort to thread over feet.     Vision Baseline Vision/History: 0 No visual deficits Ability to See in Adequate Light: 0 Adequate Patient Visual Report: No change from baseline       Perception  Praxis      Pertinent Vitals/Pain Pain Assessment: No/denies pain     Hand Dominance     Extremity/Trunk Assessment Upper Extremity Assessment Upper Extremity Assessment: Overall WFL for tasks assessed   Lower Extremity Assessment Lower Extremity Assessment: RLE deficits/detail;LLE deficits/detail;Defer to PT evaluation RLE Deficits / Details: grossly 5/5, 4+/5 DF>PF, decreased S1-S2 dermatomal sensation RLE Sensation: decreased  light touch RLE Coordination: decreased gross motor LLE Deficits / Details: grossly 5/5, 4+/5 DF>PF, decreased S1-S2 dermatomal sensation LLE Sensation: decreased light touch LLE Coordination: decreased gross motor   Cervical / Trunk Assessment Cervical / Trunk Assessment: Normal   Communication Communication Communication: No difficulties   Cognition Arousal/Alertness: Awake/alert Behavior During Therapy: WFL for tasks assessed/performed Overall Cognitive Status: Within Functional Limits for tasks assessed                                     General Comments       Exercises Other Exercises Other Exercises: Pt/girlfriend instructed in log roll, falls prevention, positions for sleep while supporting neutral spinal positioning, home/routines modifications, AE/DME for ADL, and strategies to support ADL/IADL safety   Shoulder Instructions      Home Living Family/patient expects to be discharged to:: Private residence Living Arrangements: Parent Available Help at Discharge: Family Type of Home: House Home Access: Stairs to enter Secretary/administrator of Steps: 6 Entrance Stairs-Rails: Left Home Layout: One level     Bathroom Shower/Tub: Chief Strategy Officer: Standard     Home Equipment: None          Prior Functioning/Environment Level of Independence: Independent        Comments: Pt active and independent.        OT Problem List: Impaired sensation;Impaired balance (sitting and/or standing);Decreased strength      OT Treatment/Interventions: Self-care/ADL training;Therapeutic exercise;Therapeutic activities;DME and/or AE instruction;Patient/family education;Balance training    OT Goals(Current goals can be found in the care plan section) Acute Rehab OT Goals Patient Stated Goal: get back to normal OT Goal Formulation: With patient/family Time For Goal Achievement: 02/12/21 Potential to Achieve Goals: Good ADL  Goals Additional ADL Goal #1: Pt will perform all aspects of bathing and dressing with modified indepedence. Additional ADL Goal #2: Pt will verbalize plan to implement at least 1 learned falls prevention strategy during recovery to maximize safety.  OT Frequency: Min 1X/week   Barriers to D/C:            Co-evaluation              AM-PAC OT "6 Clicks" Daily Activity     Outcome Measure Help from another person eating meals?: None Help from another person taking care of personal grooming?: None Help from another person toileting, which includes using toliet, bedpan, or urinal?: A Little Help from another person bathing (including washing, rinsing, drying)?: A Little Help from another person to put on and taking off regular upper body clothing?: None Help from another person to put on and taking off regular lower body clothing?: A Little 6 Click Score: 21   End of Session Equipment Utilized During Treatment: Gait belt Nurse Communication: Mobility status  Activity Tolerance: Patient tolerated treatment well Patient left: in bed;with call bell/phone within reach;with family/visitor present  OT Visit Diagnosis: Other abnormalities of gait and mobility (R26.89)                Time:  9371-6967 OT Time Calculation (min): 32 min Charges:  OT General Charges $OT Visit: 1 Visit OT Evaluation $OT Eval Low Complexity: 1 Low OT Treatments $Self Care/Home Management : 23-37 mins  Wynona Canes, MPH, MS, OTR/L ascom [phone] 01/29/21, 1:45 PM

## 2021-01-29 NOTE — Evaluation (Addendum)
Physical Therapy Evaluation Patient Details Name: Juan Serrano MRN: [id number] DOB: 07-04-2000 Today's Date: 01/29/2021   History of Present Illness  Pt is a 20 y.o. M presenting to the ED w/ sx of numbness in LEs and perineum, admitted for cauda equina syndrome. PMH lumbar radiculopathy. Admitted for L4/L5 disc herniation, Left L4/5 microdiscectomy performed 01/29/21. No significant PMH listed in chart.  Clinical Impression  Pt asleep in bed, easily awakens to verbal cues.Pt motivated and agreeable to  therapy and is oriented x 4. Pt's PLOF is independent w/ ADLs/IADLs and mobility while working in [employer].   Primary limitations are BLE strength deficits for plantar flexion w/ 2/5 MMT and decreased sensation for L4, L5, S3 bilaterally. Due to these limitations pt demonstrates a narrowed BOS, increased tibial ER w/ excessive pronation, and absent terminal stance during gait. Pt scored a 12/24 on the DGI indicated a high risk for falls. No LOB noted during amb w/o AD, min-guard for safety. Pt ascended/descended 12 steps w/ L hand railing w/ LOB x 1 when pt attempted to climb stairs without hand rails. Pt was re-educated on safety parameters w/ all mobility including importance of hand rails. Skilled PT intervention is indicated to address deficits in function, mobility, and to return to PLOF as able.  Outpatient PT is recommended at discharge.   Follow Up Recommendations Outpatient PT;Supervision for mobility/OOB    Equipment Recommendations  None recommended by PT    Recommendations for Other Services       Precautions / Restrictions Precautions Precautions: Fall Restrictions Weight Bearing Restrictions: No      Mobility  Bed Mobility Overal bed mobility: Needs Assistance Bed Mobility: Sit to Sidelying;Sidelying to Sit   Sidelying to sit: Supervision     Sit to sidelying: Supervision General bed mobility comments: Log roll w/o bed rails, no physical assist provided     Transfers Overall transfer level: Needs assistance Equipment used: None Transfers: Sit to/from Stand Sit to Stand: Min guard         General transfer comment: from bed height w/o use of UEs  Ambulation/Gait Ambulation/Gait assistance: Min guard Gait Distance (Feet): 250 Feet Assistive device: None Gait Pattern/deviations: Decreased step length - left;Decreased stance time - right;Decreased dorsiflexion - right;Decreased dorsiflexion - left;Narrow base of support Gait velocity: decreased   General Gait Details: Increased tibial ER w/ pronation due to decreased DF; limited terminal stance & pre-swing; excessive toe flexion and hip adduction during stance;  Stairs Stairs: Yes Stairs assistance: Min guard Stair Management: One rail Left Number of Stairs: 12 General stair comments: Step-to pattern advanced to step-through pattern w/ decreaed push off; LOB x 1 w/o hand rail, pt re-educated on hand rail usage  Wheelchair Mobility    Modified Rankin (Stroke Patients Only)       Balance Overall balance assessment: Needs assistance Sitting-balance support: No upper extremity supported;Feet supported Sitting balance-Leahy Scale: Good     Standing balance support: No upper extremity supported;During functional activity Standing balance-Leahy Scale: Fair Standing balance comment: LOB x 1 w/ stepping strategy for recovery due to narrow BOS                 Standardized Balance Assessment Standardized Balance Assessment : Dynamic Gait Index   Dynamic Gait Index Level Surface: Moderate Impairment Change in Gait Speed: Moderate Impairment Gait with Horizontal Head Turns: Moderate Impairment Gait with Vertical Head Turns: Mild Impairment Gait and Pivot Turn: Mild Impairment Step Over Obstacle: Mild Impairment Step Around Obstacles:  Mild Impairment Steps: Moderate Impairment Total Score: 12       Pertinent Vitals/Pain Pain Assessment: No/denies pain    Home  Living Family/patient expects to be discharged to:: Private residence Living Arrangements: Parent Available Help at Discharge: Family Type of Home: House Home Access: Stairs to enter Entrance Stairs-Rails: Left Entrance Stairs-Number of Steps: 6 Home Layout: One level Home Equipment: None      Prior Function Level of Independence: Independent         Comments: Pt independent w/ ADLS and mobility, currently works as a Corporate investment banker.     Hand Dominance        Extremity/Trunk Assessment   Upper Extremity Assessment Upper Extremity Assessment: Overall WFL for tasks assessed RUE Deficits / Details: MMT bicep, tricep, wrist ext/flex 5/5 RUE Sensation: WNL RUE Coordination: WNL LUE Deficits / Details: MMT bicep, tricep, wrist ext/flex 5/5 LUE Sensation: WNL LUE Coordination: WNL    Lower Extremity Assessment Lower Extremity Assessment: RLE deficits/detail;LLE deficits/detail RLE Deficits / Details: Knee ext limited ~ +15 degrees, MMT Hip flexion 4/5, dorsiflexion 5/5; PF 2/5; great toe 5/5 RLE Sensation: decreased light touch (L4, L5; S3 (noted by pt)) RLE Coordination: decreased gross motor LLE Deficits / Details: Knee ext limited ~ +15 degrees, MMT Hip flexion 4/5, dorsiflexion 5/5; PF 2/5; great toe 5/5 LLE Sensation: decreased light touch (L4, L5; S3 (noted by pt)) LLE Coordination: decreased gross motor    Cervical / Trunk Assessment Cervical / Trunk Assessment: Normal  Communication   Communication: No difficulties  Cognition Arousal/Alertness: Awake/alert Behavior During Therapy: WFL for tasks assessed/performed Overall Cognitive Status: Within Functional Limits for tasks assessed                                 General Comments: friendly, AOx4      General Comments      Exercises Other Exercises Other Exercises: Pt education provided on home routines/modifications including driving w/ ADL/IDL action planning   Assessment/Plan     PT Assessment Patient needs continued PT services  PT Problem List Decreased strength;Decreased range of motion;Decreased activity tolerance;Decreased balance;Decreased mobility;Decreased coordination;Impaired sensation       PT Treatment Interventions Balance training;Gait training;Stair training;Functional mobility training;Therapeutic activities;Therapeutic exercise;Neuromuscular re-education    PT Goals (Current goals can be found in the Care Plan section)  Acute Rehab PT Goals Patient Stated Goal: to workout PT Goal Formulation: With patient Time For Goal Achievement: 02/12/21 Potential to Achieve Goals: Good    Frequency 7X/week   Barriers to discharge        Co-evaluation               AM-PAC PT "6 Clicks" Mobility  Outcome Measure Help needed turning from your back to your side while in a flat bed without using bedrails?: None Help needed moving from lying on your back to sitting on the side of a flat bed without using bedrails?: None Help needed moving to and from a bed to a chair (including a wheelchair)?: A Little Help needed standing up from a chair using your arms (e.g., wheelchair or bedside chair)?: A Little Help needed to walk in hospital room?: A Little Help needed climbing 3-5 steps with a railing? : A Little 6 Click Score: 20    End of Session Equipment Utilized During Treatment: Gait belt Activity Tolerance: Patient tolerated treatment well Patient left: in bed;with SCD's reapplied;with family/visitor present;with call bell/phone within  reach;with bed alarm set Nurse Communication: Mobility status PT Visit Diagnosis: Unsteadiness on feet (R26.81);Difficulty in walking, not elsewhere classified (R26.2);Other abnormalities of gait and mobility (R26.89);Muscle weakness (generalized) (M62.81);Other symptoms and signs involving the nervous system (R29.898)    Time: 1341-1430 PT Time Calculation (min) (ACUTE ONLY): 49 min   Charges:   PT  Evaluation $PT Eval Low Complexity: 1 Low PT Treatments $Gait Training: 23-37 mins $Neuromuscular Re-education: 8-22 mins       [employer], SPT

## 2021-01-29 NOTE — Anesthesia Procedure Notes (Signed)
Procedure Name: Intubation Date/Time: 01/29/2021 12:44 AM Performed by: Karoline Caldwell, CRNA Pre-anesthesia Checklist: Patient identified, Patient being monitored, Timeout performed, Emergency Drugs available and Suction available Patient Re-evaluated:Patient Re-evaluated prior to induction Oxygen Delivery Method: Circle system utilized Preoxygenation: Pre-oxygenation with 100% oxygen Induction Type: IV induction Ventilation: Mask ventilation without difficulty Laryngoscope Size: 4 and McGraph Grade View: Grade I Tube type: Oral Tube size: 7.5 mm Number of attempts: 1 Airway Equipment and Method: Stylet Placement Confirmation: ETT inserted through vocal cords under direct vision, positive ETCO2 and breath sounds checked- equal and bilateral Secured at: 22 cm Tube secured with: Tape Dental Injury: Teeth and Oropharynx as per pre-operative assessment

## 2021-01-29 NOTE — Progress Notes (Signed)
Pt admitted in the floor with no signs of distress. Pt alert x 4. VSS. Pt was educated about safety and ascom within pt reach. Will continue to monitor.

## 2021-01-29 NOTE — Progress Notes (Signed)
PT Cancellation Note  Patient Details Name: Juan Serrano MRN: [id number] DOB: 22-Feb-2001   Cancelled Treatment:    Reason Eval/Treat Not Completed: Other (comment). Per OT, pt just recently attempted but requested more time as well as pain medication. PT to re-attempt as able.   Olga Coaster PT, DPT 10:45 AM,01/29/21

## 2021-01-29 NOTE — Plan of Care (Signed)

## 2021-01-29 NOTE — ED Notes (Signed)
2nd COVID test sent to lab

## 2021-01-29 NOTE — Discharge Instructions (Addendum)
Your surgeon has performed an operation on your lumbar spine (low back) to relieve pressure on one or more nerves. Many times, patients feel better immediately after surgery and can "overdo it." Even if you feel well, it is important that you follow these activity guidelines. If you do not let your back heal properly from the surgery, you can increase the chance of a disc herniation and/or return of your symptoms. The following are instructions to help in your recovery once you have been discharged from the hospital.   Activity    No bending, lifting, or twisting ("BLT"). Avoid lifting objects heavier than 10 pounds (gallon milk jug).  Where possible, avoid household activities that involve lifting, bending, pushing, or pulling such as laundry, vacuuming, grocery shopping, and childcare. Try to arrange for help from friends and family for these activities while your back heals.  Increase physical activity slowly as tolerated.  Taking short walks is encouraged, but avoid strenuous exercise. Do not jog, run, bicycle, lift weights, or participate in any other exercises unless specifically allowed by your doctor. Avoid prolonged sitting, including car rides.  Talk to your doctor before resuming sexual activity.  You should not drive until cleared by your doctor.  Until released by your doctor, you should not return to work or school.  You should rest at home and let your body heal.   You may shower two days after your surgery.  After showering, lightly dab your incision dry. Do not take a tub bath or go swimming for 3 weeks, or until approved by your doctor at your follow-up appointment.  If you smoke, we strongly recommend that you quit.  Smoking has been proven to interfere with normal healing in your back and will dramatically reduce the success rate of your surgery. Please contact [facility] (800-QUIT-NOW) and use the resources at www.[facility].com for assistance in stopping smoking.  Surgical  Incision   If you have a dressing on your incision, you may remove it three days after your surgery. Keep your incision area clean and dry.  If you have staples or stitches on your incision, you should have a follow up scheduled for removal. If you do not have staples or stitches, you will have steri-strips (small pieces of surgical tape) or Dermabond glue. The steri-strips/glue should begin to peel away within about a week (it is fine if the steri-strips fall off before then). If the strips are still in place one week after your surgery, you may gently remove them.  Diet            You may return to your usual diet. Be sure to stay hydrated.  When to Contact Us  Although your surgery and recovery will likely be uneventful, you may have some residual numbness, aches, and pains in your back and/or legs. This is normal and should improve in the next few weeks.  However, should you experience any of the following, contact us immediately: New numbness or weakness Pain that is progressively getting worse, and is not relieved by your pain medications or rest Bleeding, redness, swelling, pain, or drainage from surgical incision Chills or flu-like symptoms Fever greater than 101.0 F (38.3 C) Problems with bowel or bladder functions Difficulty breathing or shortness of breath Warmth, tenderness, or swelling in your calf  Contact Information During office hours (Monday-Friday 9 am to 5 pm), please call your physician at [phone] After hours and weekends, please call [phone] and speak with the answering service, who will contact the   doctor on call.  If that fails, call the [facility] Operator at [phone] and ask for the Neurosurgery Resident On Call  For a life-threatening emergency, call 911  

## 2021-01-29 NOTE — H&P (Signed)
Referring Physician:  No referring provider defined for this encounter.  Primary Physician:  Pcp, No  Chief Complaint:  LE numbness, difficulty walking, difficulty urinating  History of Present Illness: 01/29/2021 Juan Serrano is a 20 y.o. male who presents with the chief complaint of numbness in his lower extremities and perineum.  He previously had issues with a lumbar radiculopathy in the past, and has had intermittent leg pain for ~5 years.  Last Friday, he did some powerlifting and woke up Saturday with numbness in his perineum.  This progressed over the past several days to the point where his lower legs and feet were numb.  He has been able to urinate, but with great difficulty.  He is able to generate a normal urine stream, but has to concentrate and maintain significant pressure to achieve it.  He reports that he has not had significant overflow incontinence at this point.  He has not had a bowel movement since Saturday.  He reports that he has not been able to bear down due to pain.    He can walk, but has significant numbness.    The symptoms are causing a significant impact on the patient's life.   Review of Systems:  A 10 point review of systems is negative, except for the pertinent positives and negatives detailed in the HPI.  Past Medical History: History reviewed. No pertinent past medical history.  Past Surgical History: History reviewed. No pertinent surgical history.  Allergies: Allergies as of 01/28/2021   (No Known Allergies)    Medications:  Current Facility-Administered Medications:    ceFAZolin (ANCEF) IVPB 2g/100 mL premix, 2 g, Intravenous, Once, Venetia Night, MD No current outpatient medications on file.   Social History: Social History   Tobacco Use   Smoking status: Never  Substance Use Topics   Alcohol use: No   Drug use: No    Family Medical History: No family history on file.  Physical Examination: Vitals:    01/28/21 1900 01/29/21 0001  BP: 123/80 (!) 161/81  Pulse: (!) 55 60  Resp: 16 18  Temp:  97.9 F (36.6 C)  SpO2: 100% 99%   Heart sounds normal no MRG. Chest Clear to Auscultation Bilaterally.   General: Patient is well developed, well nourished, calm, collected, and in no apparent distress.  Psychiatric: Patient is anxious.  Head:  Pupils equal, round, and reactive to light.  ENT:  Oral mucosa appears well hydrated.  Neck:   Supple.  Full range of motion.  Respiratory: Patient is breathing without any difficulty.  Extremities: No edema.  Vascular: Palpable pulses in dorsal pedal vessels.  Skin:   On exposed skin, there are no abnormal skin lesions.  NEUROLOGICAL:  General: In no acute distress.   Awake, alert, oriented to person, place, and time.  Pupils equal round and reactive to light.  Facial tone is symmetric.  Tongue protrusion is midline.  There is no pronator drift.  ROM untested.  Strength: Side Biceps Triceps Deltoid Interossei Grip Wrist Ext. Wrist Flex.  R 5 5 5 5 5 5 5   L 5 5 5 5 5 5 5    Side Iliopsoas Quads Hamstring PF DF EHL  R 5 5 5 4  4+ 4  L 5 5 5 4  4+ 4-    Bilateral upper and lower extremity sensation is intact to light touch except B L5 and S1 distributions, which are diminished. Reflexes are 2+ and symmetric at the biceps, triceps, brachioradialis, patella and not  elicitable at achilles. Hoffman's is absent.  Clonus is not present.   Gait is untested due to weakness.  DRE deferred due to discomfort and concern for defecation from patient.  Imaging: MRI L spine 01/28/2021 IMPRESSION: 1. Moderate-sized central to left subarticular disc extrusion at L4-5 with resultant severe canal and left greater than right lateral recess stenosis. Neuro surgical consultation recommended. 2. Central to right subarticular disc protrusion at L5-S1 with resultant moderate right worse than left lateral recess stenosis. Either of the descending S1 nerve  roots could be affected, particularly on the right. 3. Additional mild disc bulging at L2-3 and L3-4 with resultant mild bilateral lateral recess stenosis. 4. Essentially normal MRI of the thoracic spine with only minor degenerative disc disease as above. No significant stenosis or neural impingement within the thoracic spine.     Electronically Signed   By: Rise Mu M.D.   On: 01/28/2021 21:44  I have personally reviewed the images and agree with the above interpretation.  Labs: CBC Latest Ref Rng & Units 01/28/2021  WBC 4.0 - 10.5 K/uL 7.2  Hemoglobin 13.0 - 17.0 g/dL 17.4(H)  Hematocrit 39.0 - 52.0 % 47.0  Platelets 150 - 400 K/uL 247       Assessment and Plan: [name] is a pleasant 20 y.o. male with cauda equina syndrome.  He has a large disc herniation at L4-5 that seems to be the cause of this syndrome.    Due to worsening symptoms and presence of cauda equina syndrome, I recommended L4-5 discectomy.  This will likely be possible by a left-sided approach, but there is possibility of needing to approach from R as well.  I have reviewed the symptoms with him and feel that the L5-S1 disc herniation is not the offending agent, as it is much smaller than the L4-5 disc herniation which causes critical stenosis.   Conservative management is not indicated in this situation, as he has lost some control over his urinary function and has increasing effort to produce urine.  I discussed the planned procedure at length with the patient, including the risks, benefits, alternatives, and indications. The risks discussed include but are not limited to bleeding, infection, need for reoperation, spinal fluid leak, stroke, vision loss, anesthetic complication, coma, paralysis, and even death. I also described in detail that improvement was not guaranteed.  The patient expressed understanding of these risks, and asked that we proceed with surgery. I described the surgery in  layman's terms, and gave ample opportunity for questions, which were answered to the best of my ability.    Lynnda Wiersma K. Myer Haff MD, MPHS Dept. of Neurosurgery

## 2021-01-29 NOTE — Op Note (Signed)
Indications: [name] is a 20 yo male who presented with cauda equina syndrome.  Due to worsening findings, surgery was recommended.  Findings: large disc herniation L4-5  Preoperative Diagnosis: Cauda Equina Syndroma Postoperative Diagnosis: same   EBL: 10 ml IVF: 1000 ml Drains: none Disposition: Extubated and Stable to PACU Complications: none  A foley catheter was placed.   Preoperative Note:   Risks of surgery discussed include: infection, bleeding, stroke, coma, death, paralysis, CSF leak, nerve/spinal cord injury, numbness, tingling, weakness, complex regional pain syndrome, recurrent stenosis and/or disc herniation, vascular injury, development of instability, neck/back pain, need for further surgery, persistent symptoms, development of deformity, and the risks of anesthesia. The patient understood these risks and agreed to proceed.  Operative Note:   1) Left L4/5 microdiscectomy  The patient was then brought from the preoperative center with intravenous access established.  The patient underwent general anesthesia and endotracheal tube intubation, and was then rotated on the Clifford rail top where all pressure points were appropriately padded.  The skin was then thoroughly cleansed.  Perioperative antibiotic prophylaxis was administered.  Sterile prep and drapes were then applied and a timeout was then observed.  C-arm was brought into the field under sterile conditions, and the L4-5 disc space identified and marked with an incision in the midline.  Once this was complete a 3 cm incision was opened with the use of a #10 blade knife.  The Metrx tubes were sequentially advanced under lateral fluoroscopy until a 18 x 50 mm Metrx tube was placed over the facet and lamina and secured to the bed.    The microscope was then sterilely brought into the field and muscle creep was hemostased with a bipolar and resected with a pituitary rongeur.  A Bovie extender was then used to expose  the spinous process and lamina.  Careful attention was placed to not violate the facet capsule. A 3 mm matchstick drill bit was then used to make a hemi-laminotomy trough until the ligamentum flavum was exposed.  This was extended to the base of the spinous process.  Once this was complete and the underlying ligamentum flavum was visualized this was dissected with an up angle curette and resected with a #2 and #3 mm biting Kerrison.  The laminotomy opening was also expanded in similar fashion and hemostasis was obtained with Surgifoam and a patty as well as bone wax.  The rostral aspect of the caudal level of the lamina was also resected with a #2 biting Kerrison effort to further enhance exposure.  Once the underlying dura was visualized a Penfield 4 was then used to dissect and expose the traversing nerve root.  Once this was identified a nerve root retractor suction was used to mobilize this medially.  The venous plexus was hemostased with Surgifoam and light bipolar use.  A large annular defect was noted.   The disc contents were carefully dissected.   The disc herniation was identified and dissected free using a balltip probe. The pituitary rongeur was used to remove the extruded disc fragments. Once the thecal sac and nerve root were noted to be relaxed and under less tension the ball-tipped feeler was passed along the foramen distally to to ensure no residual compression was noted.    Depo-Medrol was placed along the nerve root.  The area was irrigated. The tube system was then removed under microscopic visualization and hemostasis was obtained with a bipolar.    The fascial layer was reapproximated with the use of  a 0- Vicryl suture.  Subcutaneous tissue layer was reapproximated using 2-0 Vicryl suture.  3-0 monocryl was used on the skin. The skin was then cleansed and Dermabond was used to close the skin opening.  Patient was then rotated back to the preoperative bed awakened from anesthesia and  taken to recovery all counts are correct in this case.    Venetia Night MD

## 2021-01-30 MED ORDER — ACETAMINOPHEN 325 MG PO TABS: 650.0000 mg | ORAL_TABLET | ORAL | Status: DC | PRN

## 2021-01-30 MED ORDER — SENNOSIDES-DOCUSATE SODIUM 8.6-50 MG PO TABS: 2.0000 | ORAL_TABLET | Freq: Two times a day (BID) | ORAL | 0 refills | Status: DC | PRN

## 2021-01-30 MED ORDER — METHOCARBAMOL 500 MG PO TABS: 500.0000 mg | ORAL_TABLET | Freq: Four times a day (QID) | ORAL | 0 refills | Status: DC | PRN

## 2021-01-30 MED ORDER — OXYCODONE HCL 5 MG PO TABS: 5.0000 mg | ORAL_TABLET | ORAL | 0 refills | Status: DC | PRN

## 2021-01-30 MED ORDER — NAPROXEN 500 MG PO TABS: 500.0000 mg | ORAL_TABLET | Freq: Two times a day (BID) | ORAL | 0 refills | Status: DC | PRN

## 2021-01-30 NOTE — Discharge Summary (Signed)
Physician Discharge Summary  Patient ID: Juan Serrano MRN: [id number] DOB/AGE: [birth year] 21 y.o.  Admit date: 01/28/2021 Discharge date: 01/30/2021  Admission Diagnoses: cauda equina syndrome  Discharge Diagnoses:  Active Problems:   Cauda equina compression [facility])   Discharged Condition: good  Hospital Course: [name] was admitted with cauda equina syndrome and underwent emergency surgery for this.  He improved postoperatively and had improved control of urinary function and neurologic function.  He worked with physical therapy and was stable for discharge.  Consults: None  Significant Diagnostic Studies: radiology: X-Ray: localization at L4-5 in surgery and MRI: Severe stenosis at L4-5 with large disc herniation  Treatments: surgery: L4-5 discectomy  Discharge Exam: Blood pressure (!) 113/56, pulse (!) 55, temperature 98.5 F (36.9 C), resp. rate 16, height 6\' 3"  (1.905 m), weight 77.5 kg, SpO2 96 %. General appearance: alert and cooperative CNI 5/5 throughout Some numbness L5 and S1 distributions  Disposition: Discharge disposition: 01-Home or Self Care       Discharge Instructions     Ambulatory referral to Physical Therapy   Complete by: As directed    For cauda equina syndrome and disc herniation at L4-5.  To be scheduled local to patient in .   Incentive spirometry RT   Complete by: As directed       Allergies as of 01/30/2021   No Known Allergies      Medication List     TAKE these medications    acetaminophen 325 MG tablet Commonly known as: TYLENOL Take 2 tablets (650 mg total) by mouth every 4 (four) hours as needed for mild pain ((score 1 to 3) or temp > 100.5).   methocarbamol 500 MG tablet Commonly known as: ROBAXIN Take 1 tablet (500 mg total) by mouth every 6 (six) hours as needed for muscle spasms.   naproxen 500 MG tablet Commonly known as: Naprosyn Take 1 tablet (500 mg total) by mouth 2 (two) times daily  as needed for mild pain.   oxyCODONE 5 MG immediate release tablet Commonly known as: Oxy IR/ROXICODONE Take 1 tablet (5 mg total) by mouth every 4 (four) hours as needed for moderate pain ((score 4 to 6)).   senna-docusate 8.6-50 MG tablet Commonly known as: Senokot-S Take 2 tablets by mouth 2 (two) times daily as needed for mild constipation (take if no BM in 24 hours).        Follow-up Information     08-29-1978, [place] Follow up in 2 week(s).   Why: For incision check. This should already be scheduled through our office at the [facility]. Please call if you have any questions regarding appointment date and time. Contact information: [address] [place] [phone]                 Signed: [phone] 01/30/2021, 8:16 AM

## 2021-01-30 NOTE — Progress Notes (Signed)
    Attending Progress Note  History: Juan Serrano is here for cauda equina syndrome  POD1: Doing well. Was able to walk with PT.  Numbness improving.  Had BM.  Able to control urine.  Physical Exam: Vitals:   01/30/21 0524 01/30/21 0722  BP: (!) 121/52 (!) 113/56  Pulse: (!) 56 (!) 55  Resp: 15 16  Temp: 97.6 F (36.4 C) 98.5 F (36.9 C)  SpO2: 100% 96%    AA Ox3 CNI  Strength:5/5 throughout BLE Sensation shows L5 and S1 diminished sensation but better than preop  Data:  Recent Labs  Lab 01/28/21 1311  NA 137  K 4.0  CL 102  CO2 28  BUN 14  CREATININE 0.79  GLUCOSE 94  CALCIUM 9.4   No results for input(s): AST, ALT, ALKPHOS in the last 168 hours.  Invalid input(s): TBILI   Recent Labs  Lab 01/28/21 1311  WBC 7.2  HGB 17.4*  HCT 47.0  PLT 247   No results for input(s): APTT, INR in the last 168 hours.       Other tests/results: n/a  Assessment/Plan:  Juan Serrano is improving after microdiscectomy for cauda equina syndrome  - mobilize - pain control - DVT prophylaxis - PTOT   Venetia Night MD, [facility] Department of Neurosurgery

## 2021-01-30 NOTE — TOC Transition Note (Signed)
Transition of Care [facility]) - CM/SW Discharge Note   Patient Details  Name: Juan Serrano MRN: [id number] Date of Birth: 05-26-2000  Transition of Care [facility]) CM/SW Contact:  Bing Quarry, RN Phone Number: 01/30/2021, 1:30 PM   Clinical Narrative: Patient has discharge orders for today. Per prior CM he will set up Outpatient PT in [place]. No TOC needs at this time. Gabriel Cirri RN CM       Final next level of care: OP Rehab Barriers to Discharge: Barriers Resolved   Patient Goals and CMS Choice        Discharge Placement                       Discharge Plan and Services                DME Arranged: N/A DME Agency: NA         HH Agency: NA        Social Determinants of Health (SDOH) Interventions     Readmission Risk Interventions No flowsheet data found.

## 2021-02-03 NOTE — Anesthesia Postprocedure Evaluation (Signed)
Anesthesia Post Note  Patient: Juan Serrano  Procedure(s) Performed: Left MICRODISCECTOMY L4-5 (Bilateral: Back)  Patient location during evaluation: PACU Anesthesia Type: General Level of consciousness: awake and alert Pain management: pain level controlled Vital Signs Assessment: post-procedure vital signs reviewed and stable Respiratory status: spontaneous breathing, nonlabored ventilation, respiratory function stable and patient connected to nasal cannula oxygen Cardiovascular status: blood pressure returned to baseline and stable Postop Assessment: no apparent nausea or vomiting Anesthetic complications: no   No notable events documented.   Last Vitals:  Vitals:   01/30/21 0722 01/30/21 1201  BP: (!) 113/56 134/72  Pulse: (!) 55 (!) 55  Resp: 16 15  Temp: 36.9 C 36.9 C  SpO2: 96% 100%    Last Pain:  Vitals:   01/29/21 2222  TempSrc:   PainSc: Asleep                 Yevette Edwards

## 2022-01-11 ENCOUNTER — Ambulatory Visit: Payer: Self-pay | Admitting: Neurosurgery

## 2022-01-11 ENCOUNTER — Encounter: Payer: Self-pay | Admitting: Neurosurgery

## 2022-01-11 VITALS — BP 128/78 | Ht 75.0 in | Wt 182.0 lb

## 2022-01-11 DIAGNOSIS — G834 Cauda equina syndrome: Secondary | ICD-10-CM

## 2022-01-11 NOTE — Progress Notes (Signed)
   DOS: 01/28/22 (L L4/5 microdiscectomy)  HISTORY OF PRESENT ILLNESS: 01/11/2022 [name] is status post discectomy.  He had cauda equina syndrome when he initially presented.  He is doing well at this point.  He has no pain.  He has some numbness around his coccyx.  He has some numbness in the left S1 distribution.  He has weakness with section of his great toe on the left and with plantar flexion of his ankle  PHYSICAL EXAMINATION:   Vitals:   01/11/22 1440  BP: 128/78   General: Patient is well developed, well nourished, calm, collected, and in no apparent distress.  NEUROLOGICAL:  General: In no acute distress.  Awake, alert, oriented to person, place, and time. Pupils equal round and reactive to light.   Strength:  Side Iliopsoas Quads Hamstring PF DF EHL  R 5 5 5 5 5 5   L 5 5 5 2 3 3    Incision c/d/i   ROS (Neurologic): Negative except as noted above  IMAGING: No interval imaging to review   ASSESSMENT/PLAN:  Juan Serrano is doing well after previously presenting with cauda equina syndrome.  He has some sacral I have cauda equina syndrome, but these are stable.  We discussed his current findings.  I have released him to go back to the gym.  He will continue to manage this.  He has relocated to [facility].  I will be happy to see him in the future on an as-needed basis.  I spent a total of 10 minutes in face-to-face and non-face-to-face activities related to this patient's care today.   Lowell Bouton MD, [facility] Department of Neurosurgery
